# Patient Record
Sex: Female | Born: 1935 | Race: White | Hispanic: No | Marital: Married | Smoking: Never smoker
Health system: Southern US, Community
[De-identification: ages and names within clinical notes are randomized; demographics above are authoritative.]

## PROBLEM LIST (undated history)

## (undated) DIAGNOSIS — I509 Heart failure, unspecified: Secondary | ICD-10-CM

## (undated) DIAGNOSIS — I4891 Unspecified atrial fibrillation: Secondary | ICD-10-CM

## (undated) DIAGNOSIS — E039 Hypothyroidism, unspecified: Secondary | ICD-10-CM

## (undated) DIAGNOSIS — E876 Hypokalemia: Secondary | ICD-10-CM

## (undated) HISTORY — PX: OTHER SURGICAL HISTORY: SHX169

## (undated) HISTORY — PX: ABDOMINAL HYSTERECTOMY: SHX81

---

## 2006-06-07 ENCOUNTER — Ambulatory Visit: Payer: Self-pay | Admitting: Cardiology

## 2012-06-21 ENCOUNTER — Emergency Department (HOSPITAL_COMMUNITY): Payer: Medicare Other

## 2012-06-21 ENCOUNTER — Emergency Department (HOSPITAL_COMMUNITY)
Admission: EM | Admit: 2012-06-21 | Discharge: 2012-06-21 | Disposition: A | Discharge status: Home / Self Care | Payer: Medicare Other | Attending: Emergency Medicine | Admitting: Emergency Medicine

## 2012-06-21 ENCOUNTER — Encounter (HOSPITAL_COMMUNITY): Payer: Self-pay | Admitting: Emergency Medicine

## 2012-06-21 DIAGNOSIS — I4891 Unspecified atrial fibrillation: Secondary | ICD-10-CM | POA: Insufficient documentation

## 2012-06-21 DIAGNOSIS — I509 Heart failure, unspecified: Secondary | ICD-10-CM | POA: Insufficient documentation

## 2012-06-21 DIAGNOSIS — Z79899 Other long term (current) drug therapy: Secondary | ICD-10-CM | POA: Insufficient documentation

## 2012-06-21 DIAGNOSIS — Z7901 Long term (current) use of anticoagulants: Secondary | ICD-10-CM | POA: Insufficient documentation

## 2012-06-21 DIAGNOSIS — I959 Hypotension, unspecified: Secondary | ICD-10-CM | POA: Insufficient documentation

## 2012-06-21 DIAGNOSIS — N390 Urinary tract infection, site not specified: Secondary | ICD-10-CM | POA: Insufficient documentation

## 2012-06-21 DIAGNOSIS — E860 Dehydration: Secondary | ICD-10-CM | POA: Insufficient documentation

## 2012-06-21 DIAGNOSIS — E039 Hypothyroidism, unspecified: Secondary | ICD-10-CM | POA: Insufficient documentation

## 2012-06-21 DIAGNOSIS — D649 Anemia, unspecified: Secondary | ICD-10-CM | POA: Insufficient documentation

## 2012-06-21 DIAGNOSIS — N289 Disorder of kidney and ureter, unspecified: Secondary | ICD-10-CM | POA: Insufficient documentation

## 2012-06-21 HISTORY — DX: Hypokalemia: E87.6

## 2012-06-21 HISTORY — DX: Hypothyroidism, unspecified: E03.9

## 2012-06-21 HISTORY — DX: Heart failure, unspecified: I50.9

## 2012-06-21 HISTORY — DX: Unspecified atrial fibrillation: I48.91

## 2012-06-21 LAB — POCT I-STAT, CHEM 8
Calcium, Ion: 1.52 mmol/L — ABNORMAL HIGH (ref 1.13–1.30)
Chloride: 96 mEq/L (ref 96–112)
HCT: 24 % — ABNORMAL LOW (ref 36.0–46.0)
Potassium: 4.7 mEq/L (ref 3.5–5.1)

## 2012-06-21 LAB — URINE MICROSCOPIC-ADD ON

## 2012-06-21 LAB — URINALYSIS, ROUTINE W REFLEX MICROSCOPIC
Bilirubin Urine: NEGATIVE
Urobilinogen, UA: 0.2 mg/dL (ref 0.0–1.0)

## 2012-06-21 MED ORDER — CEPHALEXIN 500 MG PO CAPS: 500.0000 mg | ORAL_CAPSULE | Freq: Four times a day (QID) | ORAL | Status: DC

## 2012-06-21 NOTE — ED Notes (Signed)
POCT occult blood performed by Dr. Effie Shy, negative result.

## 2012-06-21 NOTE — ED Provider Notes (Signed)
Care assumed at the change of shift. Records from [facility] reviewed, for admission 04/20/2012. She had Hgb 8.2 on that admission as well, presumed to be GI bleeding losses, given transfusion, but family decided not to pursue further workup with GI on discharge. She is heme neg here. Hemodynamically stable. No episodes of hypotension here. Afrebrile, but has has UTI. Family is comfortable with her going home, Rx for Abx and close PCP follow up for recheck next week. Advised to return for any change of symptoms or for any other concerns.   Addie Alonge B. Bernette Mayers, MD 06/21/12 2311

## 2012-06-21 NOTE — ED Notes (Signed)
Per home health, patient started having some hypotension yesterday after already receiving Lotensin. Patient has not been given lotensin today, and home health care was told by MD to not give until 06/24/12.

## 2012-06-21 NOTE — ED Notes (Signed)
Patient from home, home health nurse called ems due to "blood pressure fluctuation". Home health nurse reported that patient had hypotension at home. Patient denies complaints at this time.

## 2012-06-21 NOTE — ED Provider Notes (Signed)
History     CSN: [id number]  Arrival date & time 06/21/12  2027   First MD Initiated Contact with Patient 06/21/12 2033      Chief Complaint  Patient presents with  . Hypotension    (Consider location/radiation/quality/duration/timing/severity/associated sxs/prior treatment) HPI Comments: Lynn Jordan is a 79 y.o. female who is here to be evaluated for "fluctuating blood pressure and ". The patient was well today other than a 1.0 blood pressure was low at 70/38. Another time. It was 85/40. It typically runs 100/70 the patient has been well recently and denies fever, chills, nausea, vomiting, weakness, or dizziness. She is recovering, at home, from a prolonged hospital stay, after a pneumonia. She is in home health care services. There are no other known modifying factors.  The history is provided by the patient and a relative (and her caregiver).    Past Medical History  Diagnosis Date  . Atrial fibrillation   . CHF (congestive heart failure)   . Hypokalemia   . Hypothyroid     Past Surgical History  Procedure Laterality Date  . Abdominal hysterectomy    . Bladder tack      History reviewed. No pertinent family history.  History  Substance Use Topics  . Smoking status: Not on file  . Smokeless tobacco: Not on file  . Alcohol Use: No    OB History   Grav Para Term Preterm Abortions TAB SAB Ect Mult Living                  Review of Systems  All other systems reviewed and are negative.    Allergies  Codeine; Morphine and related; and Amoxicillin  Home Medications   Current Outpatient Rx  Name  Route  Sig  Dispense  Refill  . acetaminophen (TYLENOL) 500 MG tablet   Oral   Take 500 mg by mouth every 6 (six) hours as needed for pain.         Marland Kitchen ALPRAZolam (XANAX) 0.25 MG tablet   Oral   Take 0.25 mg by mouth every 6 (six) hours as needed for sleep.         . benazepril (LOTENSIN) 10 MG tablet   Oral   Take 10 mg by mouth daily.         .  calcium-vitamin D (OSCAL WITH D) 500-200 MG-UNIT per tablet   Oral   Take 1 tablet by mouth 3 (three) times daily.         . furosemide (LASIX) 40 MG tablet   Oral   Take 40 mg by mouth 2 (two) times daily. *may take an additional one-half to one whole tablet as needed associated with 5 pounds of weight gain         . furosemide (LASIX) 40 MG tablet   Oral   Take 40 mg by mouth 2 (two) times daily.          Marland Kitchen levothyroxine (SYNTHROID, LEVOTHROID) 75 MCG tablet   Oral   Take 75 mcg by mouth daily before breakfast.         . potassium chloride SA (K-DUR,KLOR-CON) 20 MEQ tablet   Oral   Take 40 mEq by mouth 2 (two) times daily.         . pseudoephedrine-dextromethorphan-guaifenesin (TUSSIN CF) 30-10-100 MG/5ML solution   Oral   Take 10 mLs by mouth 4 (four) times daily as needed for cough.         . sodium chloride (OCEAN)  0.65 % nasal spray   Nasal   Place 2 sprays into the nose 2 (two) times daily.          Marland Kitchen warfarin (COUMADIN) 5 MG tablet   Oral   Take 5 mg by mouth every evening.            BP 118/47  Pulse 72  Temp(Src) 97.6 F (36.4 C) (Oral)  Resp 18  Ht 5\' 5"  (1.651 m)  Wt 130 lb (58.968 kg)  BMI 21.63 kg/m2  SpO2 96%  Physical Exam  Nursing note and vitals reviewed. Constitutional: She is oriented to person, place, and time. She appears well-developed.  Frail, elderly  HENT:  Head: Normocephalic and atraumatic.  Eyes: Conjunctivae and EOM are normal. Pupils are equal, round, and reactive to light.  Neck: Normal range of motion and phonation normal. Neck supple.  Cardiovascular: Normal rate, regular rhythm and intact distal pulses.   Pulmonary/Chest: Effort normal and breath sounds normal. She exhibits no tenderness.  Abdominal: Soft. Bowel sounds are normal. She exhibits no distension. There is no tenderness. There is no guarding.  Genitourinary:  Rectal exam: Normal Anus; no external hemorrhoids. Soft, brown stool in rectum. No palpable  mass. Exam was nontender. Fecal occult blood testing was negative.  Musculoskeletal: Normal range of motion. She exhibits edema (1+, bilateral).  Neurological: She is alert and oriented to person, place, and time. She has normal strength. She exhibits normal muscle tone.  Skin: Skin is warm and dry.  Psychiatric: She has a normal mood and affect. Her behavior is normal. Judgment and thought content normal.    ED Course  Procedures (including critical care time)   Reevaluation with update and discussion. After initial assessment and treatment, an updated evaluation reveals no further complaints. Her daughter recalls that her hemoglobin was 6 when she was hospitalized 3 months ago. There no comparison labs in the hospital computer system. Comparison labs ordered from [facility]. Akira Adelsberger L   Oral fluid trial    Labs Reviewed  URINALYSIS, ROUTINE W REFLEX MICROSCOPIC - Abnormal; Notable for the following:    APPearance HAZY (*)    Hgb urine dipstick SMALL (*)    Protein, ur TRACE (*)    Nitrite POSITIVE (*)    Leukocytes, UA LARGE (*)    All other components within normal limits  URINE MICROSCOPIC-ADD ON - Abnormal; Notable for the following:    Bacteria, UA MANY (*)    All other components within normal limits  POCT I-STAT, CHEM 8 - Abnormal; Notable for the following:    BUN 57 (*)    Creatinine, Ser 1.40 (*)    Glucose, Bld 120 (*)    Calcium, Ion 1.52 (*)    Hemoglobin 8.2 (*)    HCT 24.0 (*)    All other components within normal limits  URINE CULTURE   Dg Chest 2 View  06/21/2012   *RADIOLOGY REPORT*  Clinical Data: Hypotension  CHEST - 2 VIEW  Comparison: 04/21/2012  Findings: There is a marked kyphoscoliosis deformity involving the thoracic spine.  Mild cardiac enlargement noted.  No pleural effusion or edema.  No airspace consolidation.  Spondylosis is present within the thoracic spine.  IMPRESSION:  1.  No acute cardiopulmonary abnormalities.   Original Report  Authenticated By: Signa Kell, M.D.     1. Hypotensive episode   2. Dehydration   3. Renal insufficiency       MDM  Episode of hypotension, with normal blood pressure in  emergency department. 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 Hemoglobin is low, but apparently is improved from baseline. No apparent source of bleeding at the time of evaluation, in ED.     22:44-Care to oncoming provider team- to evaluate after comparison. Labs are obtained from [facility]. I believe that the patient could be discharged, , holding her Lasix for 2-3 days, pushing oral fluids, and following up with PCP in 4 or 5 days        Flint Melter, MD 06/21/12 2245

## 2012-06-24 LAB — URINE CULTURE

## 2012-06-24 LAB — OCCULT BLOOD, POC DEVICE: Fecal Occult Bld: NEGATIVE

## 2012-09-18 ENCOUNTER — Encounter (HOSPITAL_COMMUNITY): Payer: Self-pay

## 2012-09-18 ENCOUNTER — Emergency Department (HOSPITAL_COMMUNITY)
Admission: EM | Admit: 2012-09-18 | Discharge: 2012-09-18 | Disposition: A | Discharge status: Home / Self Care | Payer: Medicare Other | Attending: Emergency Medicine | Admitting: Emergency Medicine

## 2012-09-18 ENCOUNTER — Emergency Department (HOSPITAL_COMMUNITY): Payer: Medicare Other

## 2012-09-18 DIAGNOSIS — Z8639 Personal history of other endocrine, nutritional and metabolic disease: Secondary | ICD-10-CM | POA: Insufficient documentation

## 2012-09-18 DIAGNOSIS — R0682 Tachypnea, not elsewhere classified: Secondary | ICD-10-CM | POA: Insufficient documentation

## 2012-09-18 DIAGNOSIS — Y929 Unspecified place or not applicable: Secondary | ICD-10-CM | POA: Insufficient documentation

## 2012-09-18 DIAGNOSIS — S5011XA Contusion of right forearm, initial encounter: Secondary | ICD-10-CM

## 2012-09-18 DIAGNOSIS — Y939 Activity, unspecified: Secondary | ICD-10-CM | POA: Insufficient documentation

## 2012-09-18 DIAGNOSIS — I509 Heart failure, unspecified: Secondary | ICD-10-CM | POA: Insufficient documentation

## 2012-09-18 DIAGNOSIS — X58XXXA Exposure to other specified factors, initial encounter: Secondary | ICD-10-CM | POA: Insufficient documentation

## 2012-09-18 DIAGNOSIS — E039 Hypothyroidism, unspecified: Secondary | ICD-10-CM | POA: Insufficient documentation

## 2012-09-18 DIAGNOSIS — I4891 Unspecified atrial fibrillation: Secondary | ICD-10-CM | POA: Insufficient documentation

## 2012-09-18 DIAGNOSIS — I998 Other disorder of circulatory system: Secondary | ICD-10-CM | POA: Insufficient documentation

## 2012-09-18 DIAGNOSIS — Z862 Personal history of diseases of the blood and blood-forming organs and certain disorders involving the immune mechanism: Secondary | ICD-10-CM | POA: Insufficient documentation

## 2012-09-18 DIAGNOSIS — Z79899 Other long term (current) drug therapy: Secondary | ICD-10-CM | POA: Insufficient documentation

## 2012-09-18 DIAGNOSIS — S5010XA Contusion of unspecified forearm, initial encounter: Secondary | ICD-10-CM | POA: Insufficient documentation

## 2012-09-18 NOTE — ED Notes (Signed)
Pt's family and caregiver noted a large bruise to pt's right forearm today. Unsure of cause. Pt denies any known injury.

## 2012-09-18 NOTE — ED Notes (Signed)
Pt reports noticed tenderness to r forearm yesterday and today area raised and bruised.  Denies any injury.

## 2012-09-18 NOTE — ED Provider Notes (Signed)
CSN: [id number]     Arrival date & time 09/18/12  1357 History     First MD Initiated Contact with Patient 09/18/12 1413     Chief Complaint  Patient presents with  . bruise r forearm    (Consider location/radiation/quality/duration/timing/severity/associated sxs/prior Treatment) Patient is a 79 y.o. female presenting with arm injury. The history is provided by the patient and a relative. No language interpreter was used.  Arm Injury Location:  Elbow Time since incident:  1 day Upper extremity injury: unsure.   Elbow location:  R elbow Pain details:    Quality:  Aching (aching yesterday, feels "ok" today per pt.)   Radiates to:  Does not radiate   Severity:  No pain   Onset quality:  Sudden   Duration:  1 day   Timing:  Constant Chronicity:  New Dislocation: no   Foreign body present:  No foreign bodies Tetanus status:  Unknown Prior injury to area: hx of dec strength in arm due to polio in 20's. Relieved by:  Nothing Worsened by:  Nothing tried Ineffective treatments:  None tried Associated symptoms: decreased range of motion (at baseline)   Associated symptoms: no back pain, no fatigue, no fever, no neck pain, no numbness and no stiffness   Associated symptoms comment:  Raised ecchymosis over prox ulnar which has spread since yesterday per family Risk factors: no concern for non-accidental trauma, no frequent fractures and no recent illness     Past Medical History  Diagnosis Date  . Atrial fibrillation   . CHF (congestive heart failure)   . Hypokalemia   . Hypothyroid    Past Surgical History  Procedure Laterality Date  . Abdominal hysterectomy    . Bladder tack     No family history on file. History  Substance Use Topics  . Smoking status: Not on file  . Smokeless tobacco: Not on file  . Alcohol Use: No   OB History   Grav Para Term Preterm Abortions TAB SAB Ect Mult Living                 Review of Systems  Constitutional: Negative for fever,  chills, diaphoresis, activity change, appetite change and fatigue.  HENT: Negative for congestion, sore throat, facial swelling, rhinorrhea, neck pain and neck stiffness.   Eyes: Negative for photophobia and discharge.  Respiratory: Negative for cough, chest tightness and shortness of breath.   Cardiovascular: Negative for chest pain, palpitations and leg swelling.  Gastrointestinal: Negative for nausea, vomiting, abdominal pain and diarrhea.  Endocrine: Negative for polydipsia and polyuria.  Genitourinary: Negative for dysuria, frequency, difficulty urinating and pelvic pain.  Musculoskeletal: Negative for back pain, arthralgias and stiffness.  Skin: Negative for color change and wound.  Allergic/Immunologic: Negative for immunocompromised state.  Neurological: Negative for facial asymmetry, weakness, numbness and headaches.  Hematological: Bruises/bleeds easily.  Psychiatric/Behavioral: Negative for confusion and agitation.    Allergies  Codeine; Morphine and related; and Amoxicillin  Home Medications   Current Outpatient Rx  Name  Route  Sig  Dispense  Refill  . acetaminophen (TYLENOL) 500 MG tablet   Oral   Take 500 mg by mouth every 6 (six) hours as needed for pain.         Marland Kitchen ALPRAZolam (XANAX) 0.25 MG tablet   Oral   Take 0.25 mg by mouth every 6 (six) hours as needed for sleep.         . benazepril (LOTENSIN) 10 MG tablet   Oral  Take 10 mg by mouth daily.         . calcium-vitamin D (OSCAL WITH D) 500-200 MG-UNIT per tablet   Oral   Take 1 tablet by mouth 3 (three) times daily.         . Ferrous Sulfate Dried (SLOW RELEASE IRON) 45 MG TBCR   Oral   Take 2 tablets by mouth daily.         . furosemide (LASIX) 40 MG tablet   Oral   Take 20 mg by mouth 2 (two) times daily. *may take an additional one-half to one whole tablet as needed associated with 5 pounds of weight gain         . levothyroxine (SYNTHROID, LEVOTHROID) 125 MCG tablet   Oral   Take  125 mcg by mouth daily before breakfast.         . OVER THE COUNTER MEDICATION   Both Eyes   Place 1 drop into both eyes 2 (two) times daily. OTC eye drops for dry eyes         . potassium chloride (K-DUR,KLOR-CON) 10 MEQ tablet   Oral   Take 20 mEq by mouth 2 (two) times daily.         . sodium chloride (OCEAN) 0.65 % nasal spray   Nasal   Place 2 sprays into the nose 2 (two) times daily.          Marland Kitchen warfarin (COUMADIN) 5 MG tablet   Oral   Take 7.5 mg by mouth every evening.           BP 154/53  Pulse 88  Temp(Src) 97.7 F (36.5 C) (Oral)  Resp 40  Wt 135 lb (61.236 kg)  BMI 22.47 kg/m2  SpO2 95% Physical Exam  Constitutional: She is oriented to person, place, and time. She appears well-developed and well-nourished. No distress.  HENT:  Head: Normocephalic and atraumatic.  Mouth/Throat: No oropharyngeal exudate.  Eyes: Pupils are equal, round, and reactive to light.  Neck: Normal range of motion. Neck supple.  Cardiovascular: Normal rate, regular rhythm and normal heart sounds.  Exam reveals no gallop and no friction rub.   No murmur heard. Pulmonary/Chest: Breath sounds normal. Tachypnea (but was off home 2.5L by [place] during exam. ) noted. No respiratory distress. She has no wheezes. She has no rales.  Abdominal: Soft. Bowel sounds are normal. She exhibits no distension and no mass. There is no tenderness. There is no rebound and no guarding.  Musculoskeletal: Normal range of motion. She exhibits no edema and no tenderness.       Arms: Neurological: She is alert and oriented to person, place, and time.  Skin: Skin is warm and dry.  Psychiatric: She has a normal mood and affect.    ED Course   Procedures (including critical care time)  Labs Reviewed  PROTIME-INR - Abnormal; Notable for the following:    Prothrombin Time 27.5 (*)    INR 2.67 (*)    All other components within normal limits   Dg Elbow 2 Views Right  09/18/2012   *RADIOLOGY REPORT*   Clinical Data: Tenderness and bruising  RIGHT ELBOW - 2 VIEW  Comparison: None.  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  There may be a soft tissue injury posteriorly in the olecranon region.  I do not see an acute fracture.  IMPRESSION: Advanced chronic arthropathy with multiple intra-articular loose bodies.   Original Report Authenticated  By: Paulina Fusi, M.D.   Dg Forearm Right  09/18/2012   *RADIOLOGY REPORT*  Clinical Data: Bruising and tenderness of the proximal forearm.  RIGHT FOREARM - 2 VIEW  Comparison: Same day  Findings: No evidence of ulnar or radial fracture.  There is advanced chronic arthropathy of the elbow joint with multiple intra- articular loose bodies.  IMPRESSION: No acute fracture of the radius or ulna.  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   Original Report Authenticated By: Paulina Fusi, M.D.   1. Traumatic hematoma of forearm, right, initial encounter     MDM  Pt is a 79 y.o. female with Pmhx as above who presents with concern for worsening ecchymosis over proximal R ulnar.  Pt reports it was sore yesterday, but feels fine today.  Pt tachypneic upon arrival, but was off home O2, denies CP, SOB, ab pain, n/v, d/a, fever.  Pt on coumadin for afib, INR 2.67.  XR ordered and was negative for acute bony injury.  Will d/c home with instructions for elevation, observation of area.  Family warned that bruise may be wider tomorrow as blood settles.  Return precautions given for new or worsening symptoms including chest pain, trouble breathing, swelling, blood in gums, stool, urine.  .  1. Traumatic hematoma of forearm, right, initial encounter       Shanna Cisco, MD 09/18/12 1540

## 2014-09-06 ENCOUNTER — Encounter (HOSPITAL_COMMUNITY): Payer: Self-pay

## 2014-09-06 ENCOUNTER — Emergency Department (HOSPITAL_COMMUNITY): Payer: Medicare Other

## 2014-09-06 ENCOUNTER — Inpatient Hospital Stay (HOSPITAL_COMMUNITY)
Admission: EM | Admit: 2014-09-06 | Discharge: 2014-09-12 | DRG: 291 | Disposition: A | Discharge status: Hospice | Payer: Medicare Other | Attending: Internal Medicine | Admitting: Internal Medicine

## 2014-09-06 DIAGNOSIS — Z7189 Other specified counseling: Secondary | ICD-10-CM | POA: Insufficient documentation

## 2014-09-06 DIAGNOSIS — Z885 Allergy status to narcotic agent status: Secondary | ICD-10-CM

## 2014-09-06 DIAGNOSIS — J9601 Acute respiratory failure with hypoxia: Secondary | ICD-10-CM | POA: Diagnosis present

## 2014-09-06 DIAGNOSIS — I083 Combined rheumatic disorders of mitral, aortic and tricuspid valves: Secondary | ICD-10-CM | POA: Diagnosis present

## 2014-09-06 DIAGNOSIS — F419 Anxiety disorder, unspecified: Secondary | ICD-10-CM | POA: Diagnosis not present

## 2014-09-06 DIAGNOSIS — Z8249 Family history of ischemic heart disease and other diseases of the circulatory system: Secondary | ICD-10-CM | POA: Diagnosis not present

## 2014-09-06 DIAGNOSIS — E038 Other specified hypothyroidism: Secondary | ICD-10-CM

## 2014-09-06 DIAGNOSIS — I482 Chronic atrial fibrillation: Secondary | ICD-10-CM | POA: Diagnosis present

## 2014-09-06 DIAGNOSIS — I5031 Acute diastolic (congestive) heart failure: Secondary | ICD-10-CM | POA: Diagnosis not present

## 2014-09-06 DIAGNOSIS — D509 Iron deficiency anemia, unspecified: Secondary | ICD-10-CM | POA: Diagnosis not present

## 2014-09-06 DIAGNOSIS — R001 Bradycardia, unspecified: Secondary | ICD-10-CM | POA: Diagnosis not present

## 2014-09-06 DIAGNOSIS — Z7901 Long term (current) use of anticoagulants: Secondary | ICD-10-CM | POA: Diagnosis not present

## 2014-09-06 DIAGNOSIS — I959 Hypotension, unspecified: Secondary | ICD-10-CM | POA: Diagnosis not present

## 2014-09-06 DIAGNOSIS — J9600 Acute respiratory failure, unspecified whether with hypoxia or hypercapnia: Secondary | ICD-10-CM | POA: Diagnosis present

## 2014-09-06 DIAGNOSIS — I350 Nonrheumatic aortic (valve) stenosis: Secondary | ICD-10-CM

## 2014-09-06 DIAGNOSIS — I4891 Unspecified atrial fibrillation: Secondary | ICD-10-CM | POA: Diagnosis present

## 2014-09-06 DIAGNOSIS — Z79899 Other long term (current) drug therapy: Secondary | ICD-10-CM

## 2014-09-06 DIAGNOSIS — E039 Hypothyroidism, unspecified: Secondary | ICD-10-CM | POA: Diagnosis present

## 2014-09-06 DIAGNOSIS — D649 Anemia, unspecified: Secondary | ICD-10-CM | POA: Diagnosis not present

## 2014-09-06 DIAGNOSIS — I272 Other secondary pulmonary hypertension: Secondary | ICD-10-CM | POA: Diagnosis present

## 2014-09-06 DIAGNOSIS — D696 Thrombocytopenia, unspecified: Secondary | ICD-10-CM | POA: Diagnosis not present

## 2014-09-06 DIAGNOSIS — Z515 Encounter for palliative care: Secondary | ICD-10-CM | POA: Insufficient documentation

## 2014-09-06 DIAGNOSIS — Z66 Do not resuscitate: Secondary | ICD-10-CM | POA: Diagnosis present

## 2014-09-06 DIAGNOSIS — Z90710 Acquired absence of both cervix and uterus: Secondary | ICD-10-CM

## 2014-09-06 DIAGNOSIS — I10 Essential (primary) hypertension: Secondary | ICD-10-CM | POA: Diagnosis present

## 2014-09-06 DIAGNOSIS — J810 Acute pulmonary edema: Secondary | ICD-10-CM

## 2014-09-06 DIAGNOSIS — D539 Nutritional anemia, unspecified: Secondary | ICD-10-CM | POA: Diagnosis present

## 2014-09-06 DIAGNOSIS — I5033 Acute on chronic diastolic (congestive) heart failure: Secondary | ICD-10-CM | POA: Diagnosis present

## 2014-09-06 DIAGNOSIS — Z880 Allergy status to penicillin: Secondary | ICD-10-CM | POA: Diagnosis not present

## 2014-09-06 DIAGNOSIS — J9621 Acute and chronic respiratory failure with hypoxia: Secondary | ICD-10-CM | POA: Diagnosis present

## 2014-09-06 DIAGNOSIS — I509 Heart failure, unspecified: Secondary | ICD-10-CM | POA: Diagnosis not present

## 2014-09-06 DIAGNOSIS — R0602 Shortness of breath: Secondary | ICD-10-CM | POA: Diagnosis not present

## 2014-09-06 LAB — IRON AND TIBC
Iron: 43 ug/dL (ref 28–170)
Saturation Ratios: 17 % (ref 10.4–31.8)
TIBC: 256 ug/dL (ref 250–450)
UIBC: 213 ug/dL

## 2014-09-06 LAB — URINALYSIS, ROUTINE W REFLEX MICROSCOPIC
Bilirubin Urine: NEGATIVE
Glucose, UA: NEGATIVE mg/dL
Ketones, ur: NEGATIVE mg/dL
LEUKOCYTES UA: NEGATIVE
NITRITE: NEGATIVE
PH: 6 (ref 5.0–8.0)
Protein, ur: NEGATIVE mg/dL
SPECIFIC GRAVITY, URINE: 1.01 (ref 1.005–1.030)
Urobilinogen, UA: 0.2 mg/dL (ref 0.0–1.0)

## 2014-09-06 LAB — COMPREHENSIVE METABOLIC PANEL
ALK PHOS: 77 U/L (ref 38–126)
ALT: 9 U/L — AB (ref 14–54)
AST: 19 U/L (ref 15–41)
Albumin: 2.8 g/dL — ABNORMAL LOW (ref 3.5–5.0)
Anion gap: 10 (ref 5–15)
BILIRUBIN TOTAL: 1.2 mg/dL (ref 0.3–1.2)
BUN: 27 mg/dL — AB (ref 6–20)
CALCIUM: 9.8 mg/dL (ref 8.9–10.3)
CHLORIDE: 91 mmol/L — AB (ref 101–111)
CO2: 40 mmol/L — ABNORMAL HIGH (ref 22–32)
CREATININE: 0.78 mg/dL (ref 0.44–1.00)
GFR calc Af Amer: >60 mL/min (ref >60)
GFR calc non Af Amer: >60 mL/min (ref >60)
Glucose, Bld: 108 mg/dL — ABNORMAL HIGH (ref 65–99)
Potassium: 3.7 mmol/L (ref 3.5–5.1)
Sodium: 141 mmol/L (ref 135–145)
Total Protein: 6.5 g/dL (ref 6.5–8.1)

## 2014-09-06 LAB — CBC WITH DIFFERENTIAL/PLATELET
Basophils Absolute: 0 10*3/uL (ref 0.0–0.1)
Basophils Relative: 0 % (ref 0–1)
EOS PCT: 2 % (ref 0–5)
Eosinophils Absolute: 0.1 10*3/uL (ref 0.0–0.7)
HCT: 21.7 % — ABNORMAL LOW (ref 36.0–46.0)
Hemoglobin: 6.1 g/dL — CL (ref 12.0–15.0)
Lymphocytes Relative: 7 % — ABNORMAL LOW (ref 12–46)
Lymphs Abs: 0.4 10*3/uL — ABNORMAL LOW (ref 0.7–4.0)
MCH: 28.8 pg (ref 26.0–34.0)
MCHC: 28.1 g/dL — ABNORMAL LOW (ref 30.0–36.0)
MCV: 102.4 fL — AB (ref 78.0–100.0)
Monocytes Absolute: 0.6 10*3/uL (ref 0.1–1.0)
Monocytes Relative: 10 % (ref 3–12)
Neutro Abs: 5.3 10*3/uL (ref 1.7–7.7)
Neutrophils Relative %: 81 % — ABNORMAL HIGH (ref 43–77)
Platelets: 104 10*3/uL — ABNORMAL LOW (ref 150–400)
RBC: 2.12 MIL/uL — AB (ref 3.87–5.11)
RDW: 18.1 % — ABNORMAL HIGH (ref 11.5–15.5)
WBC: 6.4 10*3/uL (ref 4.0–10.5)

## 2014-09-06 LAB — VITAMIN B12: VITAMIN B 12: 498 pg/mL (ref 180–914)

## 2014-09-06 LAB — URINE MICROSCOPIC-ADD ON

## 2014-09-06 LAB — CBC
HCT: 28.2 % — ABNORMAL LOW (ref 36.0–46.0)
Hemoglobin: 8.5 g/dL — ABNORMAL LOW (ref 12.0–15.0)
MCH: 29.7 pg (ref 26.0–34.0)
MCHC: 30.1 g/dL (ref 30.0–36.0)
MCV: 98.6 fL (ref 78.0–100.0)
Platelets: 86 10*3/uL — ABNORMAL LOW (ref 150–400)
RBC: 2.86 MIL/uL — AB (ref 3.87–5.11)
RDW: 17.5 % — ABNORMAL HIGH (ref 11.5–15.5)
WBC: 5.7 10*3/uL (ref 4.0–10.5)

## 2014-09-06 LAB — RETICULOCYTES
RBC.: 2.13 MIL/uL — ABNORMAL LOW (ref 3.87–5.11)
RETIC COUNT ABSOLUTE: 85.2 10*3/uL (ref 19.0–186.0)
Retic Ct Pct: 4 % — ABNORMAL HIGH (ref 0.4–3.1)

## 2014-09-06 LAB — TROPONIN I
TROPONIN I: 0.03 ng/mL (ref <0.031)
Troponin I: 0.03 ng/mL (ref <0.031)
Troponin I: 0.03 ng/mL (ref <0.031)

## 2014-09-06 LAB — PREPARE RBC (CROSSMATCH)

## 2014-09-06 LAB — FOLATE: Folate: 10.3 ng/mL (ref >5.9)

## 2014-09-06 LAB — ABO/RH: ABO/RH(D): A NEG

## 2014-09-06 LAB — MRSA PCR SCREENING: MRSA by PCR: NEGATIVE

## 2014-09-06 LAB — FERRITIN: FERRITIN: 114 ng/mL (ref 11–307)

## 2014-09-06 LAB — OCCULT BLOOD, POC DEVICE: Fecal Occult Bld: NEGATIVE

## 2014-09-06 LAB — TSH: TSH: 0.127 u[IU]/mL — ABNORMAL LOW (ref 0.350–4.500)

## 2014-09-06 LAB — PROTIME-INR
INR: 2.27 — AB (ref 0.00–1.49)
Prothrombin Time: 24.8 seconds — ABNORMAL HIGH (ref 11.6–15.2)

## 2014-09-06 LAB — BRAIN NATRIURETIC PEPTIDE: B NATRIURETIC PEPTIDE 5: 403 pg/mL — AB (ref 0.0–100.0)

## 2014-09-06 MED ORDER — SODIUM CHLORIDE 0.9 % IJ SOLN
3.0000 mL | Freq: Two times a day (BID) | INTRAMUSCULAR | Status: DC
  Administered 2014-09-06 – 2014-09-12 (×13): 3 mL via INTRAVENOUS

## 2014-09-06 MED ORDER — POTASSIUM CHLORIDE CRYS ER 20 MEQ PO TBCR
20.0000 meq | EXTENDED_RELEASE_TABLET | Freq: Two times a day (BID) | ORAL | Status: DC
  Administered 2014-09-06 – 2014-09-07 (×3): 20 meq via ORAL
  Filled 2014-09-06 (×3): qty 1

## 2014-09-06 MED ORDER — CALCIUM CARBONATE-VITAMIN D 500-200 MG-UNIT PO TABS
1.0000 | ORAL_TABLET | Freq: Three times a day (TID) | ORAL | Status: DC
  Administered 2014-09-06 – 2014-09-12 (×18): 1 via ORAL
  Filled 2014-09-06 (×26): qty 1

## 2014-09-06 MED ORDER — SODIUM CHLORIDE 0.9 % IV SOLN
Freq: Once | INTRAVENOUS | Status: AC
  Administered 2014-09-06: 16:00:00 via INTRAVENOUS

## 2014-09-06 MED ORDER — DEXTROSE 5 % IV SOLN
5.0000 mg/h | INTRAVENOUS | Status: DC
  Administered 2014-09-06: 5 mg/h via INTRAVENOUS
  Filled 2014-09-06: qty 100

## 2014-09-06 MED ORDER — DILTIAZEM LOAD VIA INFUSION
10.0000 mg | Freq: Once | INTRAVENOUS | Status: AC
  Administered 2014-09-06: 10 mg via INTRAVENOUS
  Filled 2014-09-06: qty 10

## 2014-09-06 MED ORDER — WARFARIN SODIUM 7.5 MG PO TABS
7.5000 mg | ORAL_TABLET | Freq: Once | ORAL | Status: AC
  Administered 2014-09-06: 7.5 mg via ORAL
  Filled 2014-09-06: qty 1

## 2014-09-06 MED ORDER — ALPRAZOLAM 0.25 MG PO TABS
0.2500 mg | ORAL_TABLET | Freq: Four times a day (QID) | ORAL | Status: DC | PRN
  Administered 2014-09-07 – 2014-09-08 (×3): 0.25 mg via ORAL
  Filled 2014-09-06 (×3): qty 1

## 2014-09-06 MED ORDER — FUROSEMIDE 10 MG/ML IJ SOLN
40.0000 mg | Freq: Two times a day (BID) | INTRAMUSCULAR | Status: DC
  Administered 2014-09-06 – 2014-09-08 (×4): 40 mg via INTRAVENOUS
  Filled 2014-09-06 (×4): qty 4

## 2014-09-06 MED ORDER — LEVOTHYROXINE SODIUM 25 MCG PO TABS
125.0000 ug | ORAL_TABLET | Freq: Every day | ORAL | Status: DC
  Administered 2014-09-07 – 2014-09-12 (×6): 125 ug via ORAL
  Filled 2014-09-06 (×12): qty 1

## 2014-09-06 MED ORDER — ENSURE ENLIVE PO LIQD
237.0000 mL | Freq: Two times a day (BID) | ORAL | Status: DC
  Administered 2014-09-07 – 2014-09-12 (×9): 237 mL via ORAL

## 2014-09-06 MED ORDER — ONDANSETRON HCL 4 MG/2ML IJ SOLN: 4.0000 mg | Freq: Four times a day (QID) | INTRAMUSCULAR | Status: DC | PRN

## 2014-09-06 MED ORDER — SODIUM CHLORIDE 0.9 % IV SOLN
250.0000 mL | INTRAVENOUS | Status: DC | PRN
  Administered 2014-09-06: 250 mL via INTRAVENOUS

## 2014-09-06 MED ORDER — FUROSEMIDE 10 MG/ML IJ SOLN
40.0000 mg | Freq: Once | INTRAMUSCULAR | Status: AC
  Administered 2014-09-06: 40 mg via INTRAVENOUS
  Filled 2014-09-06: qty 4

## 2014-09-06 MED ORDER — SODIUM CHLORIDE 0.9 % IJ SOLN
3.0000 mL | INTRAMUSCULAR | Status: DC | PRN
  Administered 2014-09-09 – 2014-09-10 (×3): 3 mL via INTRAVENOUS
  Filled 2014-09-06 (×3): qty 3

## 2014-09-06 MED ORDER — ASPIRIN EC 81 MG PO TBEC
81.0000 mg | DELAYED_RELEASE_TABLET | Freq: Every day | ORAL | Status: DC
  Administered 2014-09-06 – 2014-09-08 (×3): 81 mg via ORAL
  Filled 2014-09-06 (×3): qty 1

## 2014-09-06 MED ORDER — CETYLPYRIDINIUM CHLORIDE 0.05 % MT LIQD
7.0000 mL | Freq: Two times a day (BID) | OROMUCOSAL | Status: DC
  Administered 2014-09-06 – 2014-09-12 (×11): 7 mL via OROMUCOSAL

## 2014-09-06 MED ORDER — WARFARIN - PHARMACIST DOSING INPATIENT
Freq: Every day | Status: DC
Start: 2014-09-06 — End: 2014-09-07

## 2014-09-06 MED ORDER — SODIUM CHLORIDE 0.9 % IV SOLN: 10.0000 mL/h | Freq: Once | INTRAVENOUS | Status: DC

## 2014-09-06 MED ORDER — BENAZEPRIL HCL 10 MG PO TABS
10.0000 mg | ORAL_TABLET | Freq: Every day | ORAL | Status: DC
Start: 2014-09-07 — End: 2014-09-08
  Administered 2014-09-07 – 2014-09-08 (×2): 10 mg via ORAL
  Filled 2014-09-06 (×3): qty 1

## 2014-09-06 MED ORDER — CARVEDILOL 3.125 MG PO TABS
3.1250 mg | ORAL_TABLET | Freq: Two times a day (BID) | ORAL | Status: DC
Start: 2014-09-06 — End: 2014-09-08
  Administered 2014-09-06 – 2014-09-08 (×3): 3.125 mg via ORAL
  Filled 2014-09-06 (×6): qty 1

## 2014-09-06 MED ORDER — ACETAMINOPHEN 325 MG PO TABS
650.0000 mg | ORAL_TABLET | ORAL | Status: DC | PRN
  Administered 2014-09-09 – 2014-09-12 (×5): 650 mg via ORAL
  Filled 2014-09-06 (×5): qty 2

## 2014-09-06 NOTE — H&P (Addendum)
[facility]          History and Physical    PCP:   Terald Sleeper, PA-C   EDP: Leo Grosser, MD  Chief Complaint:  SOB, LE swelling  HPI: 81 y/o woman with history significant for CHF type unknown, atrial fibrillation and hypothyroidism who presents to the hospital today with the above complaints. Wife at bedside provides most of the history and states that for the past 6 days she has had increasing lower extremity swelling. Today her caregiver became concerned because in the days that she had not seen the patient the swelling was a lot worse as well as increased shortness of breath to the point where they had to increase her oxygen from her typical 20 half to 3-1/2. Upon arrival of EMS she was noted to be in the 70s on 3.5 L. She was also noted to be in atrial fibrillation with rapid ventricular response. On arrival to the ED, she was started on a Cardizem drip, she was noted to have a hemoglobin of 6.1, chest x-ray showed pulmonary edema, we're asked to admit her for further evaluation and management.  Allergies:   Allergies  Allergen Reactions  . Codeine Nausea And Vomiting  . Morphine And Related Nausea And Vomiting  . Amoxicillin Nausea And Vomiting and Rash      Past Medical History  Diagnosis Date  . Atrial fibrillation   . CHF (congestive heart failure)   . Hypokalemia   . Hypothyroid     Past Surgical History  Procedure Laterality Date  . Abdominal hysterectomy    . Bladder tack      Prior to Admission medications   Medication Sig Start Date End Date Taking? Authorizing Provider  ALPRAZolam (XANAX) 0.25 MG tablet Take 0.25 mg by mouth every 6 (six) hours as needed for sleep.   Yes Historical Provider, MD  benazepril (LOTENSIN) 10 MG tablet Take 10 mg by mouth daily.   Yes Historical Provider, MD  calcium-vitamin D (OSCAL WITH D) 500-200 MG-UNIT per tablet Take 1 tablet by mouth 3 (three) times daily.   Yes Historical Provider, MD    Ferrous Sulfate Dried (SLOW RELEASE IRON) 45 MG TBCR Take 2 tablets by mouth daily.   Yes Historical Provider, MD  furosemide (LASIX) 40 MG tablet Take 20 mg by mouth 2 (two) times daily. *may take an additional one-half to one whole tablet as needed associated with 5 pounds of weight gain   Yes Historical Provider, MD  levothyroxine (SYNTHROID, LEVOTHROID) 125 MCG tablet Take 125 mcg by mouth daily before breakfast.   Yes Historical Provider, MD  OVER THE COUNTER MEDICATION Place 1 drop into both eyes 2 (two) times daily. OTC eye drops for dry eyes   Yes Historical Provider, MD  potassium chloride (K-DUR,KLOR-CON) 10 MEQ tablet Take 20 mEq by mouth 2 (two) times daily.   Yes Historical Provider, MD  sodium chloride (OCEAN) 0.65 % nasal spray Place 2 sprays into the nose 2 (two) times daily.    Yes Historical Provider, MD  warfarin (COUMADIN) 5 MG tablet Take 7.5 mg by mouth every evening.    Yes Historical Provider, MD    Social History:  reports that she has never smoked. She does not have any smokeless tobacco history on file. She reports that she does not drink alcohol or use illicit drugs.  FAmily HIstory CHF in both parents, HTn in both  parents.  Review of Systems:  Constitutional: Denies fever, chills, diaphoresis, appetite change and fatigue.  HEENT: Denies photophobia, eye pain, redness, hearing loss, ear pain, congestion, sore throat, rhinorrhea, sneezing, mouth sores, trouble swallowing, neck pain, neck stiffness and tinnitus.   Respiratory: Denies chest tightness,  and wheezing.   Cardiovascular: Denies chest pain, palpitations  Gastrointestinal: Denies nausea, vomiting, abdominal pain, diarrhea, constipation, blood in stool and abdominal distention.  Genitourinary: Denies dysuria, urgency, frequency, hematuria, flank pain and difficulty urinating.  Endocrine: Denies: hot or cold intolerance, sweats, changes in hair or nails, polyuria, polydipsia. Musculoskeletal: Denies  myalgias, back pain, joint swelling, arthralgias and gait problem.  Skin: Denies pallor, rash and wound.  Neurological: Denies dizziness, seizures, syncope, weakness, light-headedness, numbness and headaches.  Hematological: Denies adenopathy. Easy bruising, personal or family bleeding history  Psychiatric/Behavioral: Denies suicidal ideation, mood changes, confusion, nervousness, sleep disturbance and agitation   Physical Exam: Blood pressure 125/51, pulse 84, temperature 98.3 F (36.8 C), temperature source Oral, resp. rate 22, SpO2 100 %. General: Alert, awake, oriented 3. HEENT: Normocephalic, atraumatic, pupils equal and reactive to light, extraocular movements intact. Neck: Supple, no lymphadenopathy, no bruits, no goiter. Cardiovascular: Regular rate and rhythm, no murmurs, rubs or gallops. Lungs: Bibasilar crackles, no wheezes or rhonchi. Abdomen: Soft, nontender, nondistended, positive bowel sounds, no masses or organomegaly noted. Extremities: 4++ pitting edema bilaterally all the way up to her waist Neurologic: Grossly intact, moves all 4 spontaneously  Labs on Admission:  Results for orders placed or performed during the hospital encounter of 09/06/14 (from the past 48 hour(s))  Urinalysis, Routine w reflex microscopic (not at [facility])     Status: Abnormal   Collection Time: 09/06/14 12:51 PM  Result Value Ref Range   Color, Urine YELLOW YELLOW   APPearance CLEAR CLEAR   Specific Gravity, Urine 1.010 1.005 - 1.030   pH 6.0 5.0 - 8.0   Glucose, UA NEGATIVE NEGATIVE mg/dL   Hgb urine dipstick MODERATE (A) NEGATIVE   Bilirubin Urine NEGATIVE NEGATIVE   Ketones, ur NEGATIVE NEGATIVE mg/dL   Protein, ur NEGATIVE NEGATIVE mg/dL   Urobilinogen, UA 0.2 0.0 - 1.0 mg/dL   Nitrite NEGATIVE NEGATIVE   Leukocytes, UA NEGATIVE NEGATIVE  Urine microscopic-add on     Status: Abnormal   Collection Time: 09/06/14 12:51 PM  Result Value Ref Range   Squamous Epithelial / LPF MANY (A)  RARE   RBC / HPF 7-10 <3 RBC/hpf  Comprehensive metabolic panel     Status: Abnormal   Collection Time: 09/06/14  1:02 PM  Result Value Ref Range   Sodium 141 135 - 145 mmol/L   Potassium 3.7 3.5 - 5.1 mmol/L   Chloride 91 (L) 101 - 111 mmol/L   CO2 40 (H) 22 - 32 mmol/L   Glucose, Bld 108 (H) 65 - 99 mg/dL   BUN 27 (H) 6 - 20 mg/dL   Creatinine, Ser 0.78 0.44 - 1.00 mg/dL   Calcium 9.8 8.9 - 10.3 mg/dL   Total Protein 6.5 6.5 - 8.1 g/dL   Albumin 2.8 (L) 3.5 - 5.0 g/dL   AST 19 15 - 41 U/L   ALT 9 (L) 14 - 54 U/L   Alkaline Phosphatase 77 38 - 126 U/L   Total Bilirubin 1.2 0.3 - 1.2 mg/dL   GFR calc non Af Amer >60 >60 mL/min   GFR calc Af Amer >60 >60 mL/min    Comment: (NOTE) The eGFR has been calculated using the CKD EPI equation.  This calculation has not been validated in all clinical situations. eGFR's persistently <60 mL/min signify possible Chronic Kidney Disease.    Anion gap 10 5 - 15  CBC with Differential/Platelet     Status: Abnormal   Collection Time: 09/06/14  1:02 PM  Result Value Ref Range   WBC 6.4 4.0 - 10.5 K/uL   RBC 2.12 (L) 3.87 - 5.11 MIL/uL   Hemoglobin 6.1 (LL) 12.0 - 15.0 g/dL    Comment: CRITICAL RESULT CALLED TO, READ BACK BY AND VERIFIED WITH: CREWS M. AT 1326 ON [id number] BY THOMPSON S.    HCT 21.7 (L) 36.0 - 46.0 %   MCV 102.4 (H) 78.0 - 100.0 fL   MCH 28.8 26.0 - 34.0 pg   MCHC 28.1 (L) 30.0 - 36.0 g/dL   RDW 18.1 (H) 11.5 - 15.5 %   Platelets 104 (L) 150 - 400 K/uL   Neutrophils Relative % 81 (H) 43 - 77 %   Lymphocytes Relative 7 (L) 12 - 46 %   Monocytes Relative 10 3 - 12 %   Eosinophils Relative 2 0 - 5 %   Basophils Relative 0 0 - 1 %   Neutro Abs 5.3 1.7 - 7.7 K/uL   Lymphs Abs 0.4 (L) 0.7 - 4.0 K/uL   Monocytes Absolute 0.6 0.1 - 1.0 K/uL   Eosinophils Absolute 0.1 0.0 - 0.7 K/uL   Basophils Absolute 0.0 0.0 - 0.1 K/uL   RBC Morphology POLYCHROMASIA PRESENT     Comment: BASOPHILIC STIPPLING   WBC Morphology SPECIMEN CHECKED  FOR CLOTS    Smear Review PLATELET COUNT CONFIRMED BY SMEAR   Troponin I     Status: None   Collection Time: 09/06/14  1:02 PM  Result Value Ref Range   Troponin I 0.03 <0.031 ng/mL    Comment:        NO INDICATION OF MYOCARDIAL INJURY.   Brain natriuretic peptide     Status: Abnormal   Collection Time: 09/06/14  1:02 PM  Result Value Ref Range   B Natriuretic Peptide 403.0 (H) 0.0 - 100.0 pg/mL  Protime-INR     Status: Abnormal   Collection Time: 09/06/14  1:02 PM  Result Value Ref Range   Prothrombin Time 24.8 (H) 11.6 - 15.2 seconds   INR 2.27 (H) 0.00 - 1.49  Reticulocytes     Status: Abnormal   Collection Time: 09/06/14  1:03 PM  Result Value Ref Range   Retic Ct Pct 4.0 (H) 0.4 - 3.1 %   RBC. 2.13 (L) 3.87 - 5.11 MIL/uL   Retic Count, Manual 85.2 19.0 - 186.0 K/uL  Prepare RBC     Status: None   Collection Time: 09/06/14  1:57 PM  Result Value Ref Range   Order Confirmation ORDER PROCESSED BY BLOOD BANK   Type and screen     Status: None (Preliminary result)   Collection Time: 09/06/14  1:57 PM  Result Value Ref Range   ABO/RH(D) A NEG    Antibody Screen PENDING    Sample Expiration 09/09/2014     Radiological Exams on Admission: Dg Chest Port 1 View  09/06/2014   CLINICAL DATA:  Shortness of breath  EXAM: PORTABLE CHEST - 1 VIEW  COMPARISON:  06/21/2012  FINDINGS: Grossly unchanged enlarged cardiac silhouette and mediastinal contours given accentuated kyphotic projection. Pulmonary vasculature appears less distinct than present examination with cephalization of flow. Interval development small bilateral effusions with associated worsening heterogeneous/consolidative opacities, left greater than right. No  definite pneumothorax. Grossly unchanged bones.  IMPRESSION: Suspected development of pulmonary edema, bilateral effusions and associated bibasilar atelectasis on this portable kyphotic examination.   Electronically Signed   By: Sandi Mariscal M.D.   On: 09/06/2014 13:07     Assessment/Plan Principal Problem:   Acute respiratory failure Active Problems:   Acute exacerbation of CHF (congestive heart failure)   Atrial fibrillation with RVR   Hypothyroidism    Acute hypoxemic respiratory failure -On account of congestive heart failure. -Per minute oxygen support as necessary. -See below for details.  Acute CHF, type unknown -Patient has never had an echocardiogram in our system. -She is clinically showing signs of massive volume overload. -Strict intake and output, daily weights, Will place, Foley catheter to accurately manage fluid balance, will start her on Lasix 40 mg IV twice a day and adjust according to fluid balance. -Continue ACE inhibitor, add low-dose beta blocker. -Start aspirin.  Atrial fibrillation with rapid ventricular response -Patient has a history of this, is already on Coumadin for anticoagulation. -We'll add Coreg for rate control. -Was started on diltiazem IV in the ED which I will discontinue as she has now converted to sinus rhythm and as to not plummet her blood pressure to the point where I am unable to adequately diurese her. -Suspect a rapid ventricular responses in response to massive volume overload.  Anemia -Hemoglobin was found to be 6.1. -Have no prior baseline to compare.  -She is Hemoccult-negative. -Have requested anemia panel be drawn and then for her to be transfused 2 units of PRBCs. -Certainly her low hemoglobin may be contributing to both her rapid ventricular response with atrial fibrillation as well as her acute CHF exacerbation. -MCV is 102. ?B12/folate deficiency.  Hypothyroidism -Check TSH, continue home dose of Synthroid.  DVT prophylaxis -Is already fully anticoagulated on Coumadin.  CODE STATUS  -DO NOT RESUSCITATE as discussed with patient and daughter at bedside.   Time Spent on Admission: 95 minutes  [facility] Pager: [phone] 09/06/2014, 3:02  PM

## 2014-09-06 NOTE — ED Notes (Signed)
CRITICAL VALUE ALERT  Critical value received:  hemaglobin  Date of notification:  09/06/2014  Time of notification:  1330  Critical value read back:Yes.    Nurse who received alert:  Berdine Dance RN  MD notified (1st page):  Jeraldine Loots  Time of first page:  1330  MD notified (2nd page):  Time of second page:  Responding MD:  Jeraldine Loots  Time MD responded:  1330

## 2014-09-06 NOTE — ED Notes (Signed)
Per EMS, pt's sat's at home were 72 % on 3.25 liters of 02. Pt states she is more SOB today than usual

## 2014-09-06 NOTE — ED Notes (Signed)
Pt readjusted in bed. Pt cleaned of stool. Hemoccult negative. NAD

## 2014-09-06 NOTE — Progress Notes (Signed)
ANTICOAGULATION CONSULT NOTE - Initial Consult  Pharmacy Consult for Coumadin Indication: atrial fibrillation  Allergies  Allergen Reactions  . Codeine Nausea And Vomiting  . Morphine And Related Nausea And Vomiting  . Amoxicillin Nausea And Vomiting and Rash    Patient Measurements:   Heparin Dosing Weight:   Vital Signs: Temp: 98 F (36.7 C) (07/31 1515) Temp Source: Oral (07/31 1515) BP: 139/49 mmHg (07/31 1515) Pulse Rate: 82 (07/31 1515)  Labs:  Recent Labs  09/06/14 1302  HGB 6.1*  HCT 21.7*  PLT 104*  LABPROT 24.8*  INR 2.27*  CREATININE 0.78  TROPONINI 0.03    CrCl cannot be calculated (Unknown ideal weight.).   Medical History: Past Medical History  Diagnosis Date  . Atrial fibrillation   . CHF (congestive heart failure)   . Hypokalemia   . Hypothyroid     Medications:  Prescriptions prior to admission  Medication Sig Dispense Refill Last Dose  . ALPRAZolam (XANAX) 0.25 MG tablet Take 0.25 mg by mouth every 6 (six) hours as needed for sleep.   09/05/2014 at Unknown time  . benazepril (LOTENSIN) 10 MG tablet Take 10 mg by mouth daily.   09/06/2014 at Unknown time  . calcium-vitamin D (OSCAL WITH D) 500-200 MG-UNIT per tablet Take 1 tablet by mouth 3 (three) times daily.   09/06/2014 at Unknown time  . Ferrous Sulfate Dried (SLOW RELEASE IRON) 45 MG TBCR Take 2 tablets by mouth daily.   09/06/2014 at Unknown time  . furosemide (LASIX) 40 MG tablet Take 20 mg by mouth 2 (two) times daily. *may take an additional one-half to one whole tablet as needed associated with 5 pounds of weight gain   09/06/2014 at Unknown time  . levothyroxine (SYNTHROID, LEVOTHROID) 125 MCG tablet Take 125 mcg by mouth daily before breakfast.   09/06/2014 at Unknown time  . OVER THE COUNTER MEDICATION Place 1 drop into both eyes 2 (two) times daily. OTC eye drops for dry eyes   09/06/2014 at Unknown time  . potassium chloride (K-DUR,KLOR-CON) 10 MEQ tablet Take 20 mEq by mouth 2  (two) times daily.   09/06/2014 at Unknown time  . sodium chloride (OCEAN) 0.65 % nasal spray Place 2 sprays into the nose 2 (two) times daily.    09/06/2014 at Unknown time  . warfarin (COUMADIN) 5 MG tablet Take 7.5 mg by mouth every evening.    09/05/2014 at 1700    Assessment: Continuation of PTA Warfarin for AFIB INR therapeutic on admission  Goal of Therapy:  INR 2-3 Monitor platelets by anticoagulation protocol: Yes   Plan:  Coumadin 7.5 mg po x 1 dose today (home regiment) INR/PT daily Monitor CBC/platelets    Raylon Lamson Bennett 09/06/2014,3:58 PM

## 2014-09-06 NOTE — Progress Notes (Signed)
Pt transferred to ICU from ED. Pt is alert and oriented. VS are stable. Pt has no complaints at this time. Will begin first blood transfusion.

## 2014-09-06 NOTE — ED Provider Notes (Signed)
CSN: [id number]     Arrival date & time 09/06/14  1218 History   First MD Initiated Contact with Patient 09/06/14 1222     No chief complaint on file.    (Consider location/radiation/quality/duration/timing/severity/associated sxs/prior Treatment) Patient is a 81 y.o. female presenting with shortness of breath. The history is provided by the patient.  Shortness of Breath Severity:  Moderate Onset quality:  Gradual Duration:  2 weeks Timing:  Constant Progression:  Worsening Chronicity:  New Relieved by:  Nothing Worsened by:  Nothing tried Associated symptoms: no abdominal pain, no chest pain, no cough, no fever, no sputum production and no vomiting   Risk factors comment:  CHF   Past Medical History  Diagnosis Date  . Atrial fibrillation   . CHF (congestive heart failure)   . Hypokalemia   . Hypothyroid    Past Surgical History  Procedure Laterality Date  . Abdominal hysterectomy    . Bladder tack     No family history on file. History  Substance Use Topics  . Smoking status: Never Smoker   . Smokeless tobacco: Not on file  . Alcohol Use: No   OB History    No data available     Review of Systems  Constitutional: Positive for fatigue and unexpected weight change (weight gain). Negative for fever.  Respiratory: Positive for shortness of breath. Negative for cough and sputum production.   Cardiovascular: Positive for leg swelling. Negative for chest pain.  Gastrointestinal: Negative for vomiting and abdominal pain.  All other systems reviewed and are negative.     Allergies  Codeine; Morphine and related; and Amoxicillin  Home Medications   Prior to Admission medications   Medication Sig Start Date End Date Taking? Authorizing Provider  acetaminophen (TYLENOL) 500 MG tablet Take 500 mg by mouth every 6 (six) hours as needed for pain.    Historical Provider, MD  ALPRAZolam Prudy Feeler) 0.25 MG tablet Take 0.25 mg by mouth every 6 (six) hours as needed for  sleep.    Historical Provider, MD  benazepril (LOTENSIN) 10 MG tablet Take 10 mg by mouth daily.    Historical Provider, MD  calcium-vitamin D (OSCAL WITH D) 500-200 MG-UNIT per tablet Take 1 tablet by mouth 3 (three) times daily.    Historical Provider, MD  Ferrous Sulfate Dried (SLOW RELEASE IRON) 45 MG TBCR Take 2 tablets by mouth daily.    Historical Provider, MD  furosemide (LASIX) 40 MG tablet Take 20 mg by mouth 2 (two) times daily. *may take an additional one-half to one whole tablet as needed associated with 5 pounds of weight gain    Historical Provider, MD  levothyroxine (SYNTHROID, LEVOTHROID) 125 MCG tablet Take 125 mcg by mouth daily before breakfast.    Historical Provider, MD  OVER THE COUNTER MEDICATION Place 1 drop into both eyes 2 (two) times daily. OTC eye drops for dry eyes    Historical Provider, MD  potassium chloride (K-DUR,KLOR-CON) 10 MEQ tablet Take 20 mEq by mouth 2 (two) times daily.    Historical Provider, MD  sodium chloride (OCEAN) 0.65 % nasal spray Place 2 sprays into the nose 2 (two) times daily.     Historical Provider, MD  warfarin (COUMADIN) 5 MG tablet Take 7.5 mg by mouth every evening.     Historical Provider, MD   BP 112/63 mmHg  Pulse 117  Temp(Src) 97.8 F (36.6 C) (Oral)  Resp 26  SpO2 100% Physical Exam  Constitutional: She is oriented to person, place,  and time. She appears well-developed and well-nourished. No distress.  HENT:  Head: Normocephalic.  Eyes: Conjunctivae are normal.  Neck: Neck supple. No tracheal deviation present.  Cardiovascular: An irregularly irregular rhythm present. Tachycardia present.   Murmur heard.  Systolic murmur is present with a grade of 3/6  Pulmonary/Chest: Effort normal. No respiratory distress. She has rales (diffuse bilateral).  Abdominal: Soft. She exhibits no distension.  Left side abdominal wall edema  Musculoskeletal:       Left forearm: She exhibits edema (non-pitting).       Right lower leg: She  exhibits edema (4+ pitting).       Left lower leg: She exhibits edema (4+ pitting).  Neurological: She is alert and oriented to person, place, and time. GCS eye subscore is 4. GCS verbal subscore is 5. GCS motor subscore is 6.  Skin: Skin is warm and dry. No cyanosis.  Psychiatric: She has a normal mood and affect.    ED Course  Procedures (including critical care time) CRITICAL CARE Performed by: Lyndal Pulley Total critical care time: 30 Critical care time was exclusive of separately billable procedures and treating other patients. Critical care was necessary to treat or prevent imminent or life-threatening deterioration. Critical care was time spent personally by me on the following activities: development of treatment plan with patient and/or surrogate as well as nursing, discussions with consultants, evaluation of patient's response to treatment, examination of patient, obtaining history from patient or surrogate, ordering and performing treatments and interventions, ordering and review of laboratory studies, ordering and review of radiographic studies, pulse oximetry and re-evaluation of patient's condition.   Labs Review Labs Reviewed  COMPREHENSIVE METABOLIC PANEL - Abnormal; Notable for the following:    Chloride 91 (*)    CO2 40 (*)    Glucose, Bld 108 (*)    BUN 27 (*)    Albumin 2.8 (*)    ALT 9 (*)    All other components within normal limits  CBC WITH DIFFERENTIAL/PLATELET - Abnormal; Notable for the following:    RBC 2.12 (*)    Hemoglobin 6.1 (*)    HCT 21.7 (*)    MCV 102.4 (*)    MCHC 28.1 (*)    RDW 18.1 (*)    Platelets 104 (*)    Neutrophils Relative % 81 (*)    Lymphocytes Relative 7 (*)    Lymphs Abs 0.4 (*)    All other components within normal limits  BRAIN NATRIURETIC PEPTIDE - Abnormal; Notable for the following:    B Natriuretic Peptide 403.0 (*)    All other components within normal limits  URINALYSIS, ROUTINE W REFLEX MICROSCOPIC (NOT AT [facility]) -  Abnormal; Notable for the following:    Hgb urine dipstick MODERATE (*)    All other components within normal limits  PROTIME-INR - Abnormal; Notable for the following:    Prothrombin Time 24.8 (*)    INR 2.27 (*)    All other components within normal limits  URINE MICROSCOPIC-ADD ON - Abnormal; Notable for the following:    Squamous Epithelial / LPF MANY (*)    All other components within normal limits  TROPONIN I  VITAMIN B12  FOLATE  IRON AND TIBC  FERRITIN  RETICULOCYTES  POC OCCULT BLOOD, ED  PREPARE RBC (CROSSMATCH)  TYPE AND SCREEN    Imaging Review Dg Chest Port 1 View  09/06/2014   CLINICAL DATA:  Shortness of breath  EXAM: PORTABLE CHEST - 1 VIEW  COMPARISON:  06/21/2012  FINDINGS: Grossly unchanged enlarged cardiac silhouette and mediastinal contours given accentuated kyphotic projection. Pulmonary vasculature appears less distinct than present examination with cephalization of flow. Interval development small bilateral effusions with associated worsening heterogeneous/consolidative opacities, left greater than right. No definite pneumothorax. Grossly unchanged bones.  IMPRESSION: Suspected development of pulmonary edema, bilateral effusions and associated bibasilar atelectasis on this portable kyphotic examination.   Electronically Signed   By: Simonne Come M.D.   On: 09/06/2014 13:07   I independently viewed and interpreted the above radiology studies and agree with radiologist report.    EKG Interpretation   Date/Time:  Sunday September 06 2014 12:26:30 EDT Ventricular Rate:  105 PR Interval:    QRS Duration: 88 QT Interval:  375 QTC Calculation: 496 R Axis:   135 Text Interpretation:  Atrial fibrillation with rvr Probable right  ventricular hypertrophy Abnormal lateral Q waves Confirmed by Hadassah Rana MD,  Reuel Boom ([id number]) on 09/06/2014 12:52:51 PM      MDM   Final diagnoses:  Shortness of breath    81 year old female with history of congestive heart failure presents  with gradually worsening shortness of breath over the last 2 weeks. Her daughter is at bedside to provide most of the history. She states that she has had increased leg swelling and shortness of breath. She is on 3.5 L of oxygen at home normally and has required more. On arrival of EMS the patient was saturating in the 70s required some oxygen but recovered and we were able to decrease her oxygen requirement to just above her normal level. Chest x-ray is consistent with pulmonary edema, patient is in atrial fibrillation with RVR on arrival that was rate controlled with a diltiazem bolus and drip. At this time I believe decompensated heart failure may be due to ongoing rapid ventricular rate or could be secondary to fluid overload. Diuresis started with 40 mg of Lasix IV.  On screening labs patient was found to be anemic with a hemoglobin of 6.1 which is confounding diagnosis of acute systolic congestive heart failure. Type and screen and one unit of blood administered with plan to trend on an inpatient basis.  Hospitalist Dr Ardyth Harps was consulted for admission and will see the patient in the emergency department.   Lyndal Pulley, MD 09/06/14 [phone]

## 2014-09-07 ENCOUNTER — Inpatient Hospital Stay (HOSPITAL_COMMUNITY): Payer: Medicare Other

## 2014-09-07 DIAGNOSIS — J9600 Acute respiratory failure, unspecified whether with hypoxia or hypercapnia: Secondary | ICD-10-CM

## 2014-09-07 DIAGNOSIS — I509 Heart failure, unspecified: Secondary | ICD-10-CM

## 2014-09-07 DIAGNOSIS — E031 Congenital hypothyroidism without goiter: Secondary | ICD-10-CM

## 2014-09-07 LAB — BASIC METABOLIC PANEL
ANION GAP: 10 (ref 5–15)
BUN: 26 mg/dL — ABNORMAL HIGH (ref 6–20)
CO2: 39 mmol/L — ABNORMAL HIGH (ref 22–32)
Calcium: 9.6 mg/dL (ref 8.9–10.3)
Chloride: 93 mmol/L — ABNORMAL LOW (ref 101–111)
Creatinine, Ser: 0.72 mg/dL (ref 0.44–1.00)
GFR calc Af Amer: >60 mL/min (ref >60)
GFR calc non Af Amer: >60 mL/min (ref >60)
GLUCOSE: 101 mg/dL — AB (ref 65–99)
Potassium: 3.4 mmol/L — ABNORMAL LOW (ref 3.5–5.1)
Sodium: 142 mmol/L (ref 135–145)

## 2014-09-07 LAB — PROTIME-INR
INR: 2.52 — ABNORMAL HIGH (ref 0.00–1.49)
PROTHROMBIN TIME: 26.9 s — AB (ref 11.6–15.2)

## 2014-09-07 LAB — CBC
HEMATOCRIT: 27.8 % — AB (ref 36.0–46.0)
HEMOGLOBIN: 8.4 g/dL — AB (ref 12.0–15.0)
MCH: 29.4 pg (ref 26.0–34.0)
MCHC: 30.2 g/dL (ref 30.0–36.0)
MCV: 97.2 fL (ref 78.0–100.0)
PLATELETS: 90 10*3/uL — AB (ref 150–400)
RBC: 2.86 MIL/uL — ABNORMAL LOW (ref 3.87–5.11)
RDW: 17.6 % — ABNORMAL HIGH (ref 11.5–15.5)
WBC: 6 10*3/uL (ref 4.0–10.5)

## 2014-09-07 LAB — TROPONIN I: TROPONIN I: 0.03 ng/mL (ref <0.031)

## 2014-09-07 MED ORDER — POTASSIUM CHLORIDE 20 MEQ/15ML (10%) PO SOLN
20.0000 meq | Freq: Two times a day (BID) | ORAL | Status: DC
  Administered 2014-09-07 – 2014-09-12 (×10): 20 meq via ORAL
  Filled 2014-09-07 (×10): qty 30

## 2014-09-07 MED ORDER — WARFARIN SODIUM 5 MG PO TABS
5.0000 mg | ORAL_TABLET | Freq: Once | ORAL | Status: AC
  Administered 2014-09-07: 5 mg via ORAL
  Filled 2014-09-07: qty 1

## 2014-09-07 MED ORDER — WARFARIN - PHARMACIST DOSING INPATIENT
Status: DC
  Administered 2014-09-08: 16:00:00

## 2014-09-07 MED ORDER — HYDROCOD POLST-CPM POLST ER 10-8 MG/5ML PO SUER
5.0000 mL | Freq: Once | ORAL | Status: AC
  Filled 2014-09-07: qty 5

## 2014-09-07 NOTE — Progress Notes (Signed)
Patients BP 102/42 at this time and heart rate 67. Was documented that SBP was in 80's earlier today. Notified Dr. Ardyth Harps. WIll hold this dose of COreg.

## 2014-09-07 NOTE — Progress Notes (Signed)
[facility] PROGRESS NOTE  Lynn Jordan:[id number] DOB: 07/13/1925 DOA: 09/06/2014 PCP: Remus Loffler, PA-C  Assessment/Plan: Acute Hypoxemic Respiratory Failure -On account of acute CHF. -Continue to provide oxygen support as needed. -See below for details.  Acute CF, type unknown -ECHO pending. -Has massive volume overload at present. -Suspect Is and Os are inaccurate. Have discussed with RN. -Continue ACE-I and BB. -ASA. -Continue current lasix dose, may need to increase dose if not adequate diuresis over next 24 hours.  A Fib with RVR -Rate controlled at present. -Off cardizem drip. -Continue anticoagulation with coumadin.  Anemia -Was transfused 2 units of PRBCs. -B12/folate WNL. -No signs of GI Bleeding.  Hypothyroidism -Continue synthroid.  Code Status: DNR Family Communication: Patient only  Disposition Plan: Transfer to telemetry today.   Consultants:  None   Antibiotics:  None   Subjective: Feels improved, still fatigued and "very swollen"  Objective: Filed Vitals:   09/07/14 1000 09/07/14 1100 09/07/14 1132 09/07/14 1200  BP: 126/64 94/42  83/48  Pulse: 71 69  72  Temp:   98.2 F (36.8 C)   TempSrc:   Oral   Resp: Height:      Weight:      SpO2: 100% 100%  100%    Intake/Output Summary (Last 24 hours) at 09/07/14 1434 Last data filed at 09/07/14 1135  Gross per 24 hour  Intake 2025.49 ml  Output   2001 ml  Net  24.49 ml   Filed Weights   09/06/14 1520 09/06/14 1600 09/07/14 0608  Weight: 74.4 kg (164 lb 0.4 oz) 74.4 kg (164 lb 0.4 oz) 73.9 kg (162 lb 14.7 oz)    Exam:   General:  AA Ox3  Cardiovascular: RRR  Respiratory: CTA B  Abdomen: S/NT/ND/+BS  Extremities: Massive 4++ edema   Neurologic:  Moves all 4 spontaneously  Data Reviewed: Basic Metabolic Panel:  Recent Labs Lab 09/06/14 1302 09/07/14 0354  NA 141 142  K 3.7 3.4*  CL 91* 93*  CO2 40* 39*  GLUCOSE 108* 101*  BUN  27* 26*  CREATININE 0.78 0.72  CALCIUM 9.8 9.6   Liver Function Tests:  Recent Labs Lab 09/06/14 1302  AST 19  ALT 9*  ALKPHOS 77  BILITOT 1.2  PROT 6.5  ALBUMIN 2.8*   No results for input(s): LIPASE, AMYLASE in the last 168 hours. No results for input(s): AMMONIA in the last 168 hours. CBC:  Recent Labs Lab 09/06/14 1302 09/06/14 2143 09/07/14 0354  WBC 6.4 5.7 6.0  NEUTROABS 5.3  --   --   HGB 6.1* 8.5* 8.4*  HCT 21.7* 28.2* 27.8*  MCV 102.4* 98.6 97.2  PLT 104* 86* 90*   Cardiac Enzymes:  Recent Labs Lab 09/06/14 1302 09/06/14 1538 09/06/14 2143 09/07/14 0354  TROPONINI 0.03 0.03 0.03 0.03   BNP (last 3 results)  Recent Labs  09/06/14 1302  BNP 403.0*    ProBNP (last 3 results) No results for input(s): PROBNP in the last 8760 hours.  CBG: No results for input(s): GLUCAP in the last 168 hours.  Recent Results (from the past 240 hour(s))  MRSA PCR Screening     Status: None   Collection Time: 09/06/14  3:34 PM  Result Value Ref Range Status   MRSA by PCR NEGATIVE NEGATIVE Final    Comment:        The GeneXpert MRSA Assay (FDA approved for NASAL specimens only), is one component of a  comprehensive MRSA colonization surveillance program. It is not intended to diagnose MRSA infection nor to guide or monitor treatment for MRSA infections.      Studies: Dg Chest Port 1 View  09/06/2014   CLINICAL DATA:  Shortness of breath  EXAM: PORTABLE CHEST - 1 VIEW  COMPARISON:  06/21/2012  FINDINGS: Grossly unchanged enlarged cardiac silhouette and mediastinal contours given accentuated kyphotic projection. Pulmonary vasculature appears less distinct than present examination with cephalization of flow. Interval development small bilateral effusions with associated worsening heterogeneous/consolidative opacities, left greater than right. No definite pneumothorax. Grossly unchanged bones.  IMPRESSION: Suspected development of pulmonary edema, bilateral  effusions and associated bibasilar atelectasis on this portable kyphotic examination.   Electronically Signed   By: Simonne Come M.D.   On: 09/06/2014 13:07    Scheduled Meds: . antiseptic oral rinse  7 mL Mouth Rinse BID  . aspirin EC  81 mg Oral Daily  . benazepril  10 mg Oral Daily  . calcium-vitamin D  1 tablet Oral TID  . carvedilol  3.125 mg Oral BID WC  . feeding supplement (ENSURE ENLIVE)  237 mL Oral BID BM  . furosemide  40 mg Intravenous Q12H  . levothyroxine  125 mcg Oral QAC breakfast  . potassium chloride  20 mEq Oral BID  . sodium chloride  3 mL Intravenous Q12H  . warfarin  5 mg Oral Once  . [START ON 09/08/2014] Warfarin - Pharmacist Dosing Inpatient   Does not apply Q24H   Continuous Infusions:   Principal Problem:   Acute respiratory failure Active Problems:   Acute exacerbation of CHF (congestive heart failure)   Atrial fibrillation with RVR   Hypothyroidism    Time spent: 30 minutes. Greater than 50% of this time was spent in direct contact with the patient coordinating care.    Chaya Jan  [facility] Pager [phone]  If 7PM-7AM, please contact night-coverage at [email or web address], password [email or web address] 09/07/2014, 2:34 PM  LOS: 1 day

## 2014-09-07 NOTE — Progress Notes (Signed)
Called report to [phone] RN that will be getting patient in room 315

## 2014-09-07 NOTE — Care Management Note (Signed)
Case Management Note  Patient Details  Name: Lynn Jordan MRN: [id number] Date of Birth: 1925/03/03  Expected Discharge Date:  09/09/14               Expected Discharge Plan:  Home/Self Care  In-House Referral:  NA  Discharge planning Services  CM Consult  Post Acute Care Choice:  NA Choice offered to:  NA  DME Arranged:    DME Agency:     HH Arranged:    HH Agency:     Status of Service:  In process, will continue to follow  Medicare Important Message Given:    Date Medicare IM Given:    Medicare IM give by:    Date Additional Medicare IM Given:    Additional Medicare Important Message give by:     If discussed at Long Length of Stay Meetings, dates discussed:    Additional Comments: Pt is from home, her son and daughter in law live with her. The patient has 3 children who live nearby. Pt has 2 PD aids that care for her intermittently 7 days a week. The aids assist with medication management, person hygiene and food preporation. The patient is non-ambulatory, her aids pick up up for transfers and patient spends most of her time in her wheelchair or electric recliner, pt has a hospital bed also. Pt has home O2 through [facility], neb machine and home INR monitor. Pt plans to return home with resumptions of care by her aids. No home needs identified at this time, will cont to follow for DC planning.  Malcolm Metro, RN 09/07/2014, 10:41 AM

## 2014-09-07 NOTE — Progress Notes (Signed)
ANTICOAGULATION CONSULT NOTE  Pharmacy Consult for Coumadin Indication: atrial fibrillation  Allergies  Allergen Reactions  . Codeine Nausea And Vomiting  . Morphine And Related Nausea And Vomiting  . Amoxicillin Nausea And Vomiting and Rash   Patient Measurements: Height:  (162.6 cm) Weight: 162 lb 14.7 oz (73.9 kg) IBW/kg (Calculated) : 54.7  Vital Signs: Temp: 98.1 F (36.7 C) (08/01 0743) Temp Source: Oral (07/31 2356) BP: 119/60 mmHg (08/01 0800) Pulse Rate: 53 (08/01 0800)  Labs:  Recent Labs  09/06/14 1302 09/06/14 1538 09/06/14 2143 09/07/14 0354  HGB 6.1*  --  8.5* 8.4*  HCT 21.7*  --  28.2* 27.8*  PLT 104*  --  86* 90*  LABPROT 24.8*  --   --  26.9*  INR 2.27*  --   --  2.52*  CREATININE 0.78  --   --  0.72  TROPONINI 0.03 0.03 0.03 0.03   Estimated Creatinine Clearance: 48.8 mL/min (by C-G formula based on Cr of 0.72).  Medical History: Past Medical History  Diagnosis Date  . Atrial fibrillation   . CHF (congestive heart failure)   . Hypokalemia   . Hypothyroid     Medications:  Prescriptions prior to admission  Medication Sig Dispense Refill Last Dose  . ALPRAZolam (XANAX) 0.25 MG tablet Take 0.25 mg by mouth every 6 (six) hours as needed for sleep.   09/05/2014 at Unknown time  . benazepril (LOTENSIN) 10 MG tablet Take 10 mg by mouth daily.   09/06/2014 at Unknown time  . calcium-vitamin D (OSCAL WITH D) 500-200 MG-UNIT per tablet Take 1 tablet by mouth 3 (three) times daily.   09/06/2014 at Unknown time  . Ferrous Sulfate Dried (SLOW RELEASE IRON) 45 MG TBCR Take 2 tablets by mouth daily.   09/06/2014 at Unknown time  . furosemide (LASIX) 40 MG tablet Take 20 mg by mouth 2 (two) times daily. *may take an additional one-half to one whole tablet as needed associated with 5 pounds of weight gain   09/06/2014 at Unknown time  . levothyroxine (SYNTHROID, LEVOTHROID) 125 MCG tablet Take 125 mcg by mouth daily before breakfast.   09/06/2014 at Unknown  time  . OVER THE COUNTER MEDICATION Place 1 drop into both eyes 2 (two) times daily. OTC eye drops for dry eyes   09/06/2014 at Unknown time  . potassium chloride (K-DUR,KLOR-CON) 10 MEQ tablet Take 20 mEq by mouth 2 (two) times daily.   09/06/2014 at Unknown time  . sodium chloride (OCEAN) 0.65 % nasal spray Place 2 sprays into the nose 2 (two) times daily.    09/06/2014 at Unknown time  . warfarin (COUMADIN) 5 MG tablet Take 7.5 mg by mouth every evening.    09/05/2014 at 1700   Assessment: Continuation of PTA Warfarin for AFIB INR therapeutic, but trending up.  Acute CHF can elevate INR.  Goal of Therapy:  INR 2-3   Plan:  Coumadin 5 mg po x 1 dose today INR/PT daily  Lynn Jordan 09/07/2014,9:17 AM

## 2014-09-07 NOTE — Progress Notes (Signed)
Patient lying in bed. Alert and oriented. Family at bedside. No complaints voiced at this time.

## 2014-09-08 DIAGNOSIS — I10 Essential (primary) hypertension: Secondary | ICD-10-CM | POA: Diagnosis present

## 2014-09-08 DIAGNOSIS — I071 Rheumatic tricuspid insufficiency: Secondary | ICD-10-CM

## 2014-09-08 DIAGNOSIS — I270 Primary pulmonary hypertension: Secondary | ICD-10-CM

## 2014-09-08 DIAGNOSIS — E039 Hypothyroidism, unspecified: Secondary | ICD-10-CM

## 2014-09-08 DIAGNOSIS — D649 Anemia, unspecified: Secondary | ICD-10-CM | POA: Diagnosis present

## 2014-09-08 DIAGNOSIS — I340 Nonrheumatic mitral (valve) insufficiency: Secondary | ICD-10-CM

## 2014-09-08 DIAGNOSIS — J9601 Acute respiratory failure with hypoxia: Secondary | ICD-10-CM

## 2014-09-08 DIAGNOSIS — I4891 Unspecified atrial fibrillation: Secondary | ICD-10-CM

## 2014-09-08 DIAGNOSIS — R601 Generalized edema: Secondary | ICD-10-CM

## 2014-09-08 DIAGNOSIS — Z7901 Long term (current) use of anticoagulants: Secondary | ICD-10-CM

## 2014-09-08 DIAGNOSIS — I350 Nonrheumatic aortic (valve) stenosis: Secondary | ICD-10-CM

## 2014-09-08 LAB — CBC
HEMATOCRIT: 27.1 % — AB (ref 36.0–46.0)
HEMOGLOBIN: 7.9 g/dL — AB (ref 12.0–15.0)
MCH: 29.3 pg (ref 26.0–34.0)
MCHC: 29.2 g/dL — ABNORMAL LOW (ref 30.0–36.0)
MCV: 100.4 fL — ABNORMAL HIGH (ref 78.0–100.0)
PLATELETS: 100 10*3/uL — AB (ref 150–400)
RBC: 2.7 MIL/uL — ABNORMAL LOW (ref 3.87–5.11)
RDW: 17.5 % — AB (ref 11.5–15.5)
WBC: 7 10*3/uL (ref 4.0–10.5)

## 2014-09-08 LAB — T4, FREE: Free T4: 1.82 ng/dL — ABNORMAL HIGH (ref 0.61–1.12)

## 2014-09-08 LAB — PROTIME-INR
INR: 2.77 — ABNORMAL HIGH (ref 0.00–1.49)
Prothrombin Time: 28.8 seconds — ABNORMAL HIGH (ref 11.6–15.2)

## 2014-09-08 LAB — BASIC METABOLIC PANEL
ANION GAP: 6 (ref 5–15)
BUN: 30 mg/dL — ABNORMAL HIGH (ref 6–20)
CO2: 43 mmol/L — AB (ref 22–32)
CREATININE: 0.67 mg/dL (ref 0.44–1.00)
Calcium: 9.7 mg/dL (ref 8.9–10.3)
Chloride: 93 mmol/L — ABNORMAL LOW (ref 101–111)
Glucose, Bld: 91 mg/dL (ref 65–99)
Potassium: 3.6 mmol/L (ref 3.5–5.1)
Sodium: 142 mmol/L (ref 135–145)

## 2014-09-08 LAB — GLUCOSE, CAPILLARY: GLUCOSE-CAPILLARY: 123 mg/dL — AB (ref 65–99)

## 2014-09-08 MED ORDER — FUROSEMIDE 10 MG/ML IJ SOLN
80.0000 mg | Freq: Two times a day (BID) | INTRAMUSCULAR | Status: DC
  Administered 2014-09-08 – 2014-09-11 (×6): 80 mg via INTRAVENOUS
  Filled 2014-09-08 (×6): qty 8

## 2014-09-08 MED ORDER — WARFARIN SODIUM 5 MG PO TABS
5.0000 mg | ORAL_TABLET | Freq: Once | ORAL | Status: AC
  Administered 2014-09-08: 5 mg via ORAL
  Filled 2014-09-08: qty 1

## 2014-09-08 MED ORDER — BENAZEPRIL HCL 10 MG PO TABS: 5.0000 mg | ORAL_TABLET | Freq: Every day | ORAL | Status: DC

## 2014-09-08 MED ORDER — METOLAZONE 5 MG PO TABS
2.5000 mg | ORAL_TABLET | Freq: Once | ORAL | Status: AC
  Administered 2014-09-08: 2.5 mg via ORAL
  Filled 2014-09-08: qty 1

## 2014-09-08 MED ORDER — DILTIAZEM HCL 30 MG PO TABS
30.0000 mg | ORAL_TABLET | Freq: Two times a day (BID) | ORAL | Status: DC
  Administered 2014-09-11 – 2014-09-12 (×3): 30 mg via ORAL
  Filled 2014-09-08 (×7): qty 1

## 2014-09-08 NOTE — Consult Note (Addendum)
Reason for Consult:   CHF  Requesting Physician: [facility] Primary Cardiologist Dr Andee Lineman  HPI: This is a 81 y.o. female with a past medical history significant for CAF. She last saw a cardiologist (Dr Andee Lineman) in 2014 when she was hospitalized with CAP. At that time she was noted to be in AF and also found to be anemic (transfused). She has a past history if TIA and she ultimately was placed on Coumadin. She lives in her own home, her son and daughter in law live with her. They also have outside help as the pt does not ambulate. Despite her significant debilitation she has not been hospitalized since 2014.  She was admitted 09/06/14 with edema and dyspnea which have been getting worse for the past 3 weeks. In the ED she had CHF on CXR (BNP 400). She was also noted to be in AF with VR 120 and anemic withy a Hgb of 6.1. Since admission she has improved symptomatically although she has only diuresed 2 lbs (-600cc).   PMHx:  Past Medical History  Diagnosis Date  . Atrial fibrillation   . CHF (congestive heart failure)   . Hypokalemia   . Hypothyroid     Past Surgical History  Procedure Laterality Date  . Abdominal hysterectomy    . Bladder tack      SOCHx:  reports that she has never smoked. She does not have any smokeless tobacco history on file. She reports that she does not drink alcohol or use illicit drugs.  FAMHx: History reviewed. No pertinent family history.  ALLERGIES: Allergies  Allergen Reactions  . Codeine Nausea And Vomiting  . Morphine And Related Nausea And Vomiting  . Amoxicillin Nausea And Vomiting and Rash    ROS: Pertinent items are noted in HPI. no history of CAD, MI, or prior cath  HOME MEDICATIONS: Prior to Admission medications   Medication Sig Start Date End Date Taking? Authorizing Provider  ALPRAZolam (XANAX) 0.25 MG tablet Take 0.25 mg by mouth every 6 (six) hours as needed for sleep.   Yes Historical Provider, MD  benazepril  (LOTENSIN) 10 MG tablet Take 10 mg by mouth daily.   Yes Historical Provider, MD  calcium-vitamin D (OSCAL WITH D) 500-200 MG-UNIT per tablet Take 1 tablet by mouth 3 (three) times daily.   Yes Historical Provider, MD  Ferrous Sulfate Dried (SLOW RELEASE IRON) 45 MG TBCR Take 2 tablets by mouth daily.   Yes Historical Provider, MD  furosemide (LASIX) 40 MG tablet Take 20 mg by mouth 2 (two) times daily. *may take an additional one-half to one whole tablet as needed associated with 5 pounds of weight gain   Yes Historical Provider, MD  levothyroxine (SYNTHROID, LEVOTHROID) 125 MCG tablet Take 125 mcg by mouth daily before breakfast.   Yes Historical Provider, MD  OVER THE COUNTER MEDICATION Place 1 drop into both eyes 2 (two) times daily. OTC eye drops for dry eyes   Yes Historical Provider, MD  potassium chloride (K-DUR,KLOR-CON) 10 MEQ tablet Take 20 mEq by mouth 2 (two) times daily.   Yes Historical Provider, MD  sodium chloride (OCEAN) 0.65 % nasal spray Place 2 sprays into the nose 2 (two) times daily.    Yes Historical Provider, MD  warfarin (COUMADIN) 5 MG tablet Take 7.5 mg by mouth every evening.    Yes Historical Provider, MD    HOSPITAL MEDICATIONS: I have reviewed the patient's current medications.  VITALS: Blood pressure  103/54, pulse 71, temperature 97.5 F (36.4 C), temperature source Oral, resp. rate 16, height  (1.626 m), weight 162 lb 0.6 oz (73.5 kg), SpO2 99 %.  PHYSICAL EXAM: General appearance: alert, cooperative, no distress and pale, chronically ill appearing, on O2 Neck: no JVD and transmitted AS murmur Lungs: severe kyphosis, decreased breath sounds bilateraly, no rales Heart: irregularly irregular rhythm and 2/6 AS murmur AOV area and LSB Abdomen: soft, non-tender; bowel sounds normal; no masses,  no organomegaly Extremities: 2-3+ puffy LE edema Pulses: diminnished Skin: pale cool dry Neurologic: Grossly normal  LABS: Results for orders placed or performed  during the hospital encounter of 09/06/14 (from the past 24 hour(s))  Basic metabolic panel     Status: Abnormal   Collection Time: 09/08/14  6:38 AM  Result Value Ref Range   Sodium 142 135 - 145 mmol/L   Potassium 3.6 3.5 - 5.1 mmol/L   Chloride 93 (L) 101 - 111 mmol/L   CO2 43 (H) 22 - 32 mmol/L   Glucose, Bld 91 65 - 99 mg/dL   BUN 30 (H) 6 - 20 mg/dL   Creatinine, Ser 0.98 0.44 - 1.00 mg/dL   Calcium 9.7 8.9 - 11.9 mg/dL   GFR calc non Af Amer >60 >60 mL/min   GFR calc Af Amer >60 >60 mL/min   Anion gap 6 5 - 15  Protime-INR     Status: Abnormal   Collection Time: 09/08/14  6:38 AM  Result Value Ref Range   Prothrombin Time 28.8 (H) 11.6 - 15.2 seconds   INR 2.77 (H) 0.00 - 1.49  CBC     Status: Abnormal   Collection Time: 09/08/14  6:38 AM  Result Value Ref Range   WBC 7.0 4.0 - 10.5 K/uL   RBC 2.70 (L) 3.87 - 5.11 MIL/uL   Hemoglobin 7.9 (L) 12.0 - 15.0 g/dL   HCT 14.7 (L) 82.9 - 56.2 %   MCV 100.4 (H) 78.0 - 100.0 fL   MCH 29.3 26.0 - 34.0 pg   MCHC 29.2 (L) 30.0 - 36.0 g/dL   RDW 13.0 (H) 86.5 - 78.4 %   Platelets 100 (L) 150 - 400 K/uL  Glucose, capillary     Status: Abnormal   Collection Time: 09/08/14  8:22 AM  Result Value Ref Range   Glucose-Capillary 123 (H) 65 - 99 mg/dL    EKG: AF with CVR  IMAGING: Dg Chest Port 1 View  09/06/2014   CLINICAL DATA:  Shortness of breath  EXAM: PORTABLE CHEST - 1 VIEW  COMPARISON:  06/21/2012  FINDINGS: Grossly unchanged enlarged cardiac silhouette and mediastinal contours given accentuated kyphotic projection. Pulmonary vasculature appears less distinct than present examination with cephalization of flow. Interval development small bilateral effusions with associated worsening heterogeneous/consolidative opacities, left greater than right. No definite pneumothorax. Grossly unchanged bones.  IMPRESSION: Suspected development of pulmonary edema, bilateral effusions and associated bibasilar atelectasis on this portable kyphotic  examination.   Electronically Signed   By: Simonne Come M.D.   On: 09/06/2014 13:07    IMPRESSION: Principal Problem:   Acute respiratory failure Active Problems:   Acute exacerbation of CHF (congestive heart failure)   Atrial fibrillation with RVR   Anemia   Hypothyroidism   Essential hypertension   Chronic anticoagulation   RECOMMENDATION: Echo shows AS, massive RAE, and severe TR. Suspect recent decompensation multifactorial- AS progression, Rt heart failure, anemia (Hgb 6.1), and possible iatrogenic hyperthyroid state (TSH 0.127).  Currently on Lasix 80  mg IV BID. Repeat TSH, check free T4. Not sure she is a candidate for continued Coumadin Rx with anemia. MD to see.  Time Spent Directly with Patient: 45 minutes  Abelino Derrick [phone] beeper 09/08/2014, 12:10 PM   The patient was seen and examined, and I agree with the assessment and plan as documented above, with modifications as noted below. Pt admitted with acute hypoxemic respiratory failure and rapid atrial fibrillation, found to be in acute combined decompensated diastolic and right heart failure. Has had some symptomatic improvement with IV diuresis but not a significant change in weight. Also anemic and on chronic anticoagulation therapy with warfarin.  I reviewed the echocardiogram today which showed severe aortic stenosis and severe tricuspid regurgitation. Severe aortic stenosis has led to high ventricular end-diastolic filling pressures with consequent moderate mitral regurgitation and severe pulmonary hypertension with consequent RV failure.  She is hypotensive. I will stop both Coreg and benazepril. For atrial fibrillation HR control, I will start short-acting diltiazem 30 mg bid which can be increased as needed. Will continue IV Lasix for now and give one dose of metolazone 2.5 mg today and reassess urinary output on 8/3.  Given her anemia, I feel she is a poor candidate for ongoing therapy with warfarin and ASA.  In spite of negative FOBT, she likely has a GI bleed.  She is a very poor candidate for surgical management and I suspect she will have repeated hospitalizations until she succumbs to her disease with eventual LV systolic dysfunction and failure. I have discussed this with the patient and family members present in room. I will consult palliative care.

## 2014-09-08 NOTE — Progress Notes (Addendum)
[facility] PROGRESS NOTE  Lynn Jordan ZOX:[id number] DOB: 1925-12-19 DOA: 09/06/2014 PCP: Remus Loffler, PA-C  Brief HPI: Patient is an 81 year old woman admitted on 09/06/2014 with shortness of breath and lower extremity edema. She has a history of atrial fibrillation, hypothyroidism and CHF of unknown type who presented with increased shortness of breath and massive edema. She has failed to adequately diurese despite 40 mg of Lasix twice daily. Today I will increase Lasix to 80 mg twice daily, will request cardiology assistance. Echocardiogram has been done today but results are still pending. She also initially had a hemoglobin of 6.1, was transfused 2 units. Poor overall prognosis.  Assessment/Plan: Acute Hypoxemic Respiratory Failure -On account of acute CHF. -Continue to provide oxygen support as needed. -See below for details.  Acute CHF, type unknown -ECHO done with results pending. -Has massive volume overload at present. -Continue ACE-I and BB. -ASA. -Will increase lasix to 80 mg BID. -Will request Cardiology assistance with management.   A Fib with RVR -Rate controlled at present. -Off cardizem drip.  -Coreg was started evening dose was held due to Bradycardia. -Continue anticoagulation with coumadin.  -INR therapeutic.  Macrocytic Anemia -Was transfused 2 units of PRBCs. -B12/folate WNL. -No signs of GI Bleeding. -Hgb down to 7.9 today. Will follow-up closely. She may need repeat transfusion.   Hypothyroidism -Continue synthroid.  Code Status: DNR DVT prophylaxis: Warfarin Family Communication: Patient only  Disposition Plan: To be determined may need SNF.   Consultants:  Cardiology  Antibiotics:  None   Subjective: Patient reports feeling better but sleepy. Denies shortness of breath or chest pain. She has not been able to be ambulatory since admission.  Objective: Filed Vitals:   09/07/14 1804 09/07/14 2200 09/08/14 0638 09/08/14  0700  BP: 102/42 108/56 103/54   Pulse: 67 86 67   Temp:  98.2 F (36.8 C) 97.5 F (36.4 C)   TempSrc:  Oral Oral   Resp:  20 20   Height:      Weight:    73.5 kg (162 lb 0.6 oz)  SpO2:  100% 100%     Intake/Output Summary (Last 24 hours) at 09/08/14 0806 Last data filed at 09/08/14 0500  Gross per 24 hour  Intake    240 ml  Output   1100 ml  Net   -860 ml   Filed Weights   09/06/14 1600 09/07/14 0608 09/08/14 0700  Weight: 74.4 kg (164 lb 0.4 oz) 73.9 kg (162 lb 14.7 oz) 73.5 kg (162 lb 0.6 oz)    Exam:   General:  Elderly frail looking woman, kyphotic  Cardiovascular: Irregular rate. Strong systolic murmur  Respiratory: Clear to ausculation bilateral. No wheezes, rales.  Abdomen: Soft, non tender, non distented, normal bowel sound.   Extremities: Massive 4+ pedal edema bilateral. Clean dressing on right big toe. Chronic venous insufficiency skin color  changes. Edema radiates to waist.  Neurologic:  Moves all 4 spontaneously. Grossly intact.  Data Reviewed: Basic Metabolic Panel:  Recent Labs Lab 09/06/14 1302 09/07/14 0354 09/08/14 0638  NA 141 142 142  K 3.7 3.4* 3.6  CL 91* 93* 93*  CO2 40* 39* 43*  GLUCOSE 108* 101* 91  BUN 27* 26* 30*  CREATININE 0.78 0.72 0.67  CALCIUM 9.8 9.6 9.7   Liver Function Tests:  Recent Labs Lab 09/06/14 1302  AST 19  ALT 9*  ALKPHOS 77  BILITOT 1.2  PROT 6.5  ALBUMIN 2.8*   No results for  input(s): LIPASE, AMYLASE in the last 168 hours. No results for input(s): AMMONIA in the last 168 hours. CBC:  Recent Labs Lab 09/06/14 1302 09/06/14 2143 09/07/14 0354 09/08/14 0638  WBC 6.4 5.7 6.0 7.0  NEUTROABS 5.3  --   --   --   HGB 6.1* 8.5* 8.4* 7.9*  HCT 21.7* 28.2* 27.8* 27.1*  MCV 102.4* 98.6 97.2 100.4*  PLT 104* 86* 90* 100*   Cardiac Enzymes:  Recent Labs Lab 09/06/14 1302 09/06/14 1538 09/06/14 2143 09/07/14 0354  TROPONINI 0.03 0.03 0.03 0.03   BNP (last 3 results)  Recent Labs   09/06/14 1302  BNP 403.0*    ProBNP (last 3 results) No results for input(s): PROBNP in the last 8760 hours.  CBG: No results for input(s): GLUCAP in the last 168 hours.  Recent Results (from the past 240 hour(s))  MRSA PCR Screening     Status: None   Collection Time: 09/06/14  3:34 PM  Result Value Ref Range Status   MRSA by PCR NEGATIVE NEGATIVE Final    Comment:        The GeneXpert MRSA Assay (FDA approved for NASAL specimens only), is one component of a comprehensive MRSA colonization surveillance program. It is not intended to diagnose MRSA infection nor to guide or monitor treatment for MRSA infections.      Studies: Dg Chest Port 1 View  09/06/2014   CLINICAL DATA:  Shortness of breath  EXAM: PORTABLE CHEST - 1 VIEW  COMPARISON:  06/21/2012  FINDINGS: Grossly unchanged enlarged cardiac silhouette and mediastinal contours given accentuated kyphotic projection. Pulmonary vasculature appears less distinct than present examination with cephalization of flow. Interval development small bilateral effusions with associated worsening heterogeneous/consolidative opacities, left greater than right. No definite pneumothorax. Grossly unchanged bones.  IMPRESSION: Suspected development of pulmonary edema, bilateral effusions and associated bibasilar atelectasis on this portable kyphotic examination.   Electronically Signed   By: Simonne Come M.D.   On: 09/06/2014 13:07    Scheduled Meds: . antiseptic oral rinse  7 mL Mouth Rinse BID  . aspirin EC  81 mg Oral Daily  . benazepril  10 mg Oral Daily  . calcium-vitamin D  1 tablet Oral TID  . carvedilol  3.125 mg Oral BID WC  . chlorpheniramine-HYDROcodone  5 mL Oral Once  . feeding supplement (ENSURE ENLIVE)  237 mL Oral BID BM  . furosemide  40 mg Intravenous Q12H  . levothyroxine  125 mcg Oral QAC breakfast  . potassium chloride  20 mEq Oral BID  . sodium chloride  3 mL Intravenous Q12H  . Warfarin - Pharmacist Dosing Inpatient    Does not apply Q24H   Continuous Infusions:   Principal Problem:   Acute respiratory failure Active Problems:   Acute exacerbation of CHF (congestive heart failure)   Atrial fibrillation with RVR   Hypothyroidism    Time spent: 35 minutes. Greater than 50% of this time was spent in direct contact with the patient coordinating care.    Dr. Peggye Pitt, MD  [facility] Pager [phone]  If 7PM-7AM, please contact night-coverage at [email or web address], password [facility] 09/08/2014, 8:06 AM  LOS: 2 days     I, Jessica D. Jari Pigg, acting as scribe, recorded this note contemporaneously in the presence of Dr. Chaya Jan, M.D. on 09/08/2014 at 8:07 AM      I have reviewed the above documentation for accuracy and completeness, and I agree with the above.  Peggye Pitt, MD [facility]  Pager: [phone]

## 2014-09-08 NOTE — Progress Notes (Signed)
ANTICOAGULATION CONSULT NOTE  Pharmacy Consult for Coumadin Indication: atrial fibrillation  Allergies  Allergen Reactions  . Codeine Nausea And Vomiting  . Morphine And Related Nausea And Vomiting  . Amoxicillin Nausea And Vomiting and Rash   Patient Measurements: Height:  (162.6 cm) Weight: 162 lb 0.6 oz (73.5 kg) IBW/kg (Calculated) : 54.7  Vital Signs: Temp: 97.5 F (36.4 C) (08/02 0638) Temp Source: Oral (08/02 5409) BP: 103/54 mmHg (08/02 8119) Pulse Rate: 71 (08/02 0825)  Labs:  Recent Labs  09/06/14 1302 09/06/14 1538 09/06/14 2143 09/07/14 0354 09/08/14 0638  HGB 6.1*  --  8.5* 8.4* 7.9*  HCT 21.7*  --  28.2* 27.8* 27.1*  PLT 104*  --  86* 90* 100*  LABPROT 24.8*  --   --  26.9* 28.8*  INR 2.27*  --   --  2.52* 2.77*  CREATININE 0.78  --   --  0.72 0.67  TROPONINI 0.03 0.03 0.03 0.03  --    Estimated Creatinine Clearance: 48.6 mL/min (by C-G formula based on Cr of 0.67).  Medical History: Past Medical History  Diagnosis Date  . Atrial fibrillation   . CHF (congestive heart failure)   . Hypokalemia   . Hypothyroid    Medications:  Prescriptions prior to admission  Medication Sig Dispense Refill Last Dose  . ALPRAZolam (XANAX) 0.25 MG tablet Take 0.25 mg by mouth every 6 (six) hours as needed for sleep.   09/05/2014 at Unknown time  . benazepril (LOTENSIN) 10 MG tablet Take 10 mg by mouth daily.   09/06/2014 at Unknown time  . calcium-vitamin D (OSCAL WITH D) 500-200 MG-UNIT per tablet Take 1 tablet by mouth 3 (three) times daily.   09/06/2014 at Unknown time  . Ferrous Sulfate Dried (SLOW RELEASE IRON) 45 MG TBCR Take 2 tablets by mouth daily.   09/06/2014 at Unknown time  . furosemide (LASIX) 40 MG tablet Take 20 mg by mouth 2 (two) times daily. *may take an additional one-half to one whole tablet as needed associated with 5 pounds of weight gain   09/06/2014 at Unknown time  . levothyroxine (SYNTHROID, LEVOTHROID) 125 MCG tablet Take 125 mcg by  mouth daily before breakfast.   09/06/2014 at Unknown time  . OVER THE COUNTER MEDICATION Place 1 drop into both eyes 2 (two) times daily. OTC eye drops for dry eyes   09/06/2014 at Unknown time  . potassium chloride (K-DUR,KLOR-CON) 10 MEQ tablet Take 20 mEq by mouth 2 (two) times daily.   09/06/2014 at Unknown time  . sodium chloride (OCEAN) 0.65 % nasal spray Place 2 sprays into the nose 2 (two) times daily.    09/06/2014 at Unknown time  . warfarin (COUMADIN) 5 MG tablet Take 7.5 mg by mouth every evening.    09/05/2014 at 1700   Assessment: Continuation of PTA Warfarin for AFIB INR therapeutic, but trending up.  Acute CHF can elevate INR.  Goal of Therapy:  INR 2-3   Plan:  Coumadin 5 mg po x 1 dose today  INR/PT daily  Lynn Jordan 09/08/2014,11:54 AM

## 2014-09-08 NOTE — Progress Notes (Signed)
Minimal urine output despite aggressive IV diuretics. MD made aware. Bleeding and skin break down in groin area-- Interdry ordered. Excoriated sacrum--Pink foam dressing; Q2 turns and barrier cream applied to areas outside sacral foam.

## 2014-09-09 DIAGNOSIS — Z515 Encounter for palliative care: Secondary | ICD-10-CM

## 2014-09-09 DIAGNOSIS — Z7189 Other specified counseling: Secondary | ICD-10-CM | POA: Insufficient documentation

## 2014-09-09 DIAGNOSIS — I9589 Other hypotension: Secondary | ICD-10-CM

## 2014-09-09 DIAGNOSIS — D509 Iron deficiency anemia, unspecified: Secondary | ICD-10-CM

## 2014-09-09 DIAGNOSIS — I5031 Acute diastolic (congestive) heart failure: Secondary | ICD-10-CM

## 2014-09-09 LAB — BASIC METABOLIC PANEL
Anion gap: 5 (ref 5–15)
BUN: 37 mg/dL — AB (ref 6–20)
CO2: 44 mmol/L — AB (ref 22–32)
CREATININE: 0.78 mg/dL (ref 0.44–1.00)
Calcium: 9.9 mg/dL (ref 8.9–10.3)
Chloride: 92 mmol/L — ABNORMAL LOW (ref 101–111)
GFR calc non Af Amer: >60 mL/min (ref >60)
Glucose, Bld: 92 mg/dL (ref 65–99)
POTASSIUM: 4 mmol/L (ref 3.5–5.1)
SODIUM: 141 mmol/L (ref 135–145)

## 2014-09-09 LAB — TSH: TSH: 0.154 u[IU]/mL — ABNORMAL LOW (ref 0.350–4.500)

## 2014-09-09 LAB — CBC
HCT: 27.8 % — ABNORMAL LOW (ref 36.0–46.0)
HEMOGLOBIN: 8 g/dL — AB (ref 12.0–15.0)
MCH: 29.7 pg (ref 26.0–34.0)
MCHC: 28.8 g/dL — ABNORMAL LOW (ref 30.0–36.0)
MCV: 103.3 fL — ABNORMAL HIGH (ref 78.0–100.0)
PLATELETS: 91 10*3/uL — AB (ref 150–400)
RBC: 2.69 MIL/uL — ABNORMAL LOW (ref 3.87–5.11)
RDW: 17.5 % — ABNORMAL HIGH (ref 11.5–15.5)
WBC: 7 10*3/uL (ref 4.0–10.5)

## 2014-09-09 LAB — PROTIME-INR
INR: 2.69 — AB (ref 0.00–1.49)
Prothrombin Time: 28.2 seconds — ABNORMAL HIGH (ref 11.6–15.2)

## 2014-09-09 MED ORDER — METOLAZONE 5 MG PO TABS
5.0000 mg | ORAL_TABLET | Freq: Once | ORAL | Status: AC
  Administered 2014-09-09: 5 mg via ORAL
  Filled 2014-09-09: qty 1

## 2014-09-09 MED ORDER — ALPRAZOLAM 0.25 MG PO TABS
0.2500 mg | ORAL_TABLET | Freq: Every day | ORAL | Status: DC
  Administered 2014-09-09 – 2014-09-11 (×3): 0.25 mg via ORAL
  Filled 2014-09-09 (×3): qty 1

## 2014-09-09 MED ORDER — ALPRAZOLAM 0.25 MG PO TABS: 0.2500 mg | ORAL_TABLET | Freq: Every day | ORAL | Status: DC | PRN

## 2014-09-09 NOTE — Progress Notes (Signed)
ANTICOAGULATION NOTE:  Pt has been on Coumadin.    Discussed with Dr Purvis Sheffield from cardiology, Coumadin discontinued due to anemia and probable GI bleed, poor candidate.    Pharmacy has signed off, thank you for allowing pharmacy to participate in her care.  Valrie Hart, PharmD

## 2014-09-09 NOTE — Consult Note (Signed)
Consultation Note Date: 09/09/2014   Patient Name: Lynn Jordan  DOB: 16-Jun-1925  MRN: [id number]  Age / Sex: 81 y.o., female   PCP: Remus Loffler, PA-C Referring Physician: Standley Brooking, MD  Reason for Consultation: Establishing goals of care and Psychosocial/spiritual support  Palliative Care Assessment and Plan Summary of Established Goals of Care and Medical Treatment Preferences   Clinical Assessment/Narrative: Mrs. Rann looks frail and elderly lying in bed.  Her daughter, Junita Push, is at her bedside.  Mrs. Henney asks that I speak with her daughter.  Ms. Val Eagle and I talk about Mrs. Fulchers functional status, (she hasn't walked in the last 2 years, after a PNE), she is picked up for transfer, unable to bear her weight, unable to perform ADL's, except she can feed herself with set-up.  We talk about mechanical soft diet as Mrs. Bump will, at times choke on foods.   The chronic illness trajectory diagram is shared, including discussion of repeat hospitalizations.      We talk about CHF exacerbations, including medications, limiting salt, and fluid restrictions.  We talk about the difficulty in getting out of the home.   Their PCP, Dr. Charm Barges, comes to the home every 6 weeks or so.   Ms. Val Eagle states that Mrs. Roesler has stated that she doesn't want to go to SNF and her preference is to stay at home.  We talk about people deciding to not come back to the hospital, and Ms. Lemons states Ms. Cho didn't want to come this time, but they had no choice.   Ms. Val Eagle talk about her father's death from brain cancer, and how he came home to die with Hospice, ( less than 1 week).   We talk about Mrs. Fulchers stated desire to die at home, and what this will look like with CHF, including the need for support/Hospice at that time.     Ms. Val Eagle states she isn't ready to make any decisions at this time, and she was reassured that she is not being called to choose Hospice.  That  the medical team is here to help her understand what is going on with her mother and help them in this time.  She states her main worry is "her condition ", and we talk about the realities of end of life with CHF.   Ms. Val Eagle states that she wants to think about what we have discussed, and the best way to talk with her mother about this, stating... she needs to tell her this is more support to help them, and doesn't mean she should just give up.    Contacts/Participants in Discussion: Primary Decision Maker: Junita Push makes decisions for her mother. She has an older and younger brother.    HCPOA: yes  Daughter, Junita Push  Code Status/Advance Care Planning:  DNR/DNI  Concepts of do not re hospitalize and Hospice discussed.   Symptom Management:   Anxiety managed with xanax 0.25 mg PO Q 6 hours PRN, helpful at night for sleep.   Palliative Prophylaxis: No bowel regimen noted. Suggest Senna-S 1-2 tabs QD PRN.   Psycho-social/Spiritual:   Support System: Mrs. Emigh lives with her son (on dialysis) and daughter in law, and they have paid caregivers daily.   Desire for further Chaplaincy support: Not discussed today.   Prognosis: < 6 months  Discharge Planning:  Home with Home Health/vs Hospice        Chief Complaint:  SOB, LE swelling History of Present  Illness: 81 y/o woman with history significant for CHF, atrial fibrillation and hypothyroidism who presents to the hospital today with the above complaints. Daughter at bedside provides most of the history and states that for the past 6 days she has had increasing lower extremity swelling. Today her caregiver became concerned because in the days that she had not seen the patient the swelling was a lot worse as well as increased shortness of breath to the point where they had to increase her oxygen from her typical 20 half to 3-1/2. Upon arrival of EMS she was noted to be in the 70s on 3.5 L. She was also noted to be in atrial  fibrillation with rapid ventricular response. On arrival to the ED, she was started on a Cardizem drip, she was noted to have a hemoglobin of 6.1, chest x-ray showed pulmonary edema, we're asked to admit her for further evaluation and management.  She has not diuresed as well as expected.  Cardiology and Palliative consults.   Primary Diagnoses  Present on Admission:  . Acute respiratory failure . Atrial fibrillation with RVR . Hypothyroidism . Anemia . Essential hypertension  Palliative Review of Systems: Mrs. Borger denies pain, anxiety, dyspnea, N/V.  I have reviewed the medical record, interviewed the patient and family, and examined the patient. The following aspects are pertinent.  Past Medical History  Diagnosis Date  . Atrial fibrillation   . CHF (congestive heart failure)   . Hypokalemia   . Hypothyroid    History   Social History  . Marital Status: Married    Spouse Name: N/A  . Number of Children: N/A  . Years of Education: N/A   Social History Main Topics  . Smoking status: Never Smoker   . Smokeless tobacco: Not on file  . Alcohol Use: No  . Drug Use: No  . Sexual Activity: Not on file   Other Topics Concern  . None   Social History Narrative   History reviewed. No pertinent family history. Scheduled Meds: . antiseptic oral rinse  7 mL Mouth Rinse BID  . calcium-vitamin D  1 tablet Oral TID  . diltiazem  30 mg Oral Q12H  . feeding supplement (ENSURE ENLIVE)  237 mL Oral BID BM  . furosemide  80 mg Intravenous Q12H  . levothyroxine  125 mcg Oral QAC breakfast  . metolazone  5 mg Oral Once  . potassium chloride  20 mEq Oral BID  . sodium chloride  3 mL Intravenous Q12H  . Warfarin - Pharmacist Dosing Inpatient   Does not apply Q24H   Continuous Infusions:  PRN Meds:.sodium chloride, acetaminophen, ALPRAZolam, ondansetron (ZOFRAN) IV, sodium chloride Medications Prior to Admission:  Prior to Admission medications   Medication Sig Start Date End  Date Taking? Authorizing Provider  ALPRAZolam (XANAX) 0.25 MG tablet Take 0.25 mg by mouth every 6 (six) hours as needed for sleep.   Yes Historical Provider, MD  benazepril (LOTENSIN) 10 MG tablet Take 10 mg by mouth daily.   Yes Historical Provider, MD  calcium-vitamin D (OSCAL WITH D) 500-200 MG-UNIT per tablet Take 1 tablet by mouth 3 (three) times daily.   Yes Historical Provider, MD  Ferrous Sulfate Dried (SLOW RELEASE IRON) 45 MG TBCR Take 2 tablets by mouth daily.   Yes Historical Provider, MD  furosemide (LASIX) 40 MG tablet Take 20 mg by mouth 2 (two) times daily. *may take an additional one-half to one whole tablet as needed associated with 5 pounds of weight gain  Yes Historical Provider, MD  levothyroxine (SYNTHROID, LEVOTHROID) 125 MCG tablet Take 125 mcg by mouth daily before breakfast.   Yes Historical Provider, MD  OVER THE COUNTER MEDICATION Place 1 drop into both eyes 2 (two) times daily. OTC eye drops for dry eyes   Yes Historical Provider, MD  potassium chloride (K-DUR,KLOR-CON) 10 MEQ tablet Take 20 mEq by mouth 2 (two) times daily.   Yes Historical Provider, MD  sodium chloride (OCEAN) 0.65 % nasal spray Place 2 sprays into the nose 2 (two) times daily.    Yes Historical Provider, MD  warfarin (COUMADIN) 5 MG tablet Take 7.5 mg by mouth every evening.    Yes Historical Provider, MD   Allergies  Allergen Reactions  . Codeine Nausea And Vomiting  . Morphine And Related Nausea And Vomiting  . Amoxicillin Nausea And Vomiting and Rash   CBC:    Component Value Date/Time   WBC 7.0 09/09/2014 0543   HGB 8.0* 09/09/2014 0543   HCT 27.8* 09/09/2014 0543   PLT 91* 09/09/2014 0543   MCV 103.3* 09/09/2014 0543   NEUTROABS 5.3 09/06/2014 1302   LYMPHSABS 0.4* 09/06/2014 1302   MONOABS 0.6 09/06/2014 1302   EOSABS 0.1 09/06/2014 1302   BASOSABS 0.0 09/06/2014 1302   Comprehensive Metabolic Panel:    Component Value Date/Time   NA 141 09/09/2014 0543   K 4.0 09/09/2014  0543   CL 92* 09/09/2014 0543   CO2 44* 09/09/2014 0543   BUN 37* 09/09/2014 0543   CREATININE 0.78 09/09/2014 0543   GLUCOSE 92 09/09/2014 0543   CALCIUM 9.9 09/09/2014 0543   AST 19 09/06/2014 1302   ALT 9* 09/06/2014 1302   ALKPHOS 77 09/06/2014 1302   BILITOT 1.2 09/06/2014 1302   PROT 6.5 09/06/2014 1302   ALBUMIN 2.8* 09/06/2014 1302    Physical Exam: Vital Signs: BP 92/44 mmHg  Pulse 75  Temp(Src) 97.8 F (36.6 C) (Oral)  Resp 16  Ht 5\' 4"  (1.626 m)  Wt 73.7 kg (162 lb 7.7 oz)  BMI 27.88 kg/m2  SpO2 99% SpO2: SpO2: 99 % O2 Device: O2 Device: Nasal Cannula O2 Flow Rate: O2 Flow Rate (L/min): 3 L/min Intake/output summary:  Intake/Output Summary (Last 24 hours) at 09/09/14 1149 Last data filed at 09/09/14 0956  Gross per 24 hour  Intake    640 ml  Output   1675 ml  Net  -1035 ml   LBM: Last BM Date: 09/08/14 Baseline Weight: Weight: 74.4 kg (164 lb 0.4 oz) Most recent weight: Weight: 73.7 kg (162 lb 7.7 oz)  Exam Findings:  Constitutional:  Frail elderly, lying in bed, makes but not keep eye contact.  Resp: even and non labored GI:  Soft non tender Extremities: 3+ pitting edema to bl lower legs,  Musc:  Generalized weakness, bl leg strength 1/5;  Marked kyphosis.          Palliative Performance Scale: 30%              Additional Data Reviewed: Recent Labs     09/08/14  0638  09/09/14  0543  WBC  7.0  7.0  HGB  7.9*  8.0*  PLT  100*  91*  NA  142  141  BUN  30*  37*  CREATININE  0.67  0.78     Time In: 0930  Time Out: 1100 Time Total: 90 minutes.  Greater than 50%  of this time was spent counseling and coordinating care related to the above  assessment and plan. GOC discussion shared with nursing staff, CM, SW, and Dr. Irene Limbo.   Signed by: Katheran Awe, NP  Katheran Awe, NP  09/09/2014, 11:49 AM  Please contact Palliative Medicine Team phone at [phone] for questions and concerns.

## 2014-09-09 NOTE — Progress Notes (Signed)
Washed and dried both sides of groin area. Intra-Dry placed.

## 2014-09-09 NOTE — Progress Notes (Signed)
PROGRESS NOTE  Lynn Jordan:[id number] DOB: 05-Aug-1925 DOA: 09/06/2014 PCP: Remus Loffler, PA-C  Summary: 81 yo female with history of atrial fibrillation, hypothyroidism and CHF of unknown type presented with complaints of shortness of breath and lower extremity edema.  Assessment/Plan: 1. Acute on chronic hypoxic respiratory failure. Improved. Plan to wean down oxygen to baseline requirement of 2L 2. A Fib with RVR, now rate-controlled. Continue diltiazem. 3. Macrocytic anemia. Stable s/p 2 units PRBC. No workup per family wishes.  4. Acute diastolic heart failure, severe aortic stenosis, severe TR, severe PHTN, with consequent RV failure with significant bilateral LE edema. 5. Thrombocytopenia, stable. Etiology unclear present on admission. Given family, no further evaluation planned. 6. Hypothyroidism. TSH slightly low. T4 slightly high. Recommend recheck as outpatient.    Overall feels better, plan to continue diuresis.   Cardiology recommends stopping Coreg and ACE-I and starting diltiazem. Will continue Lasix. She is not a candidate for surgery.   Xanax at night, available as needed during the day.   PMT consult pending.   Code Status: DNR DVT prophylaxis: Warfarin Family Communication: Daughter (POA) bedside. Disposition Plan: 48 hours pending improvement.   Brendia Sacks, MD  [facility]  Pager [phone] If 7PM-7AM, please contact night-coverage at [email or web address], password [facility] 09/09/2014, 6:59 AM  LOS: 3 days   Consultants:  Cardiology  Procedures:  Transfusion 2 units PRBC  Echo reveals: - Left ventricle: The cavity size was normal. There was mild concentric hypertrophy. Systolic function was normal. The estimated ejection fraction was in the range of 60% to 65%. The study was not technically sufficient to allow evaluation of LV diastolic dysfunction due to atrial fibrillation. However, in context of severe aortic stenosis with  biatrial enlargement and pulmonary hypertension, findings strongly suggestive of diastolic dysfunction. Doppler parameters are consistent with both elevated ventricular end-diastolic filling pressure and elevated left atrial filling pressure. Ratio of mitral valve peak E velocity to medial annulus E (e&') velocity: 20.61. - Aortic valve: Moderately to severely calcified annulus. Moderately thickened, severely calcified leaflets. There was severe stenosis. Peak velocity (S): 411 cm/s. Mean gradient (S): 36 mm Hg. Valve area (VTI): 0.52 cm^2. Valve area (Vmax): 0.49 cm^2. Valve area (Vmean): 0.51 cm^2. - Mitral valve: Moderately to severely calcified annulus. There was moderate regurgitation. - Left atrium: The atrium was moderately to severely dilated. - Right ventricle: The cavity size was moderately dilated. Systolic function was moderately reduced. Lateral annular velocity 7.51 cm/s. - Right atrium: The atrium was massively dilated. - Atrial septum: The septum bowed from right to left, consistent with increased right atrial pressure. - Tricuspid valve: Mildly thickened leaflets. There was severe regurgitation. - Pulmonic valve: There was mild regurgitation. - Pulmonary arteries: PA peak pressure: 71 mm Hg (S). Severely elevated pulmonary pressures.   Antibiotics:    HPI/Subjective: Reports feeling better. Was given her nightly Xanax and was able to sleep without difficulty. Shortness of breath is improving. Denies n/v/d or chest pain .   Objective: Filed Vitals:   09/08/14 1505 09/08/14 2000 09/09/14 0500 09/09/14 0524  BP:   Pulse: 85 78 75   Temp: 98.5 F (36.9 C) 98.1 F (36.7 C) 97.8 F (36.6 C)   TempSrc: Oral Oral Oral   Resp: Height:      Weight:    73.7 kg (162 lb 7.7 oz)  SpO2: 100% 100% 99%     Intake/Output Summary (Last 24 hours) at 09/09/14 8295 Last data  filed at 09/09/14 0523  Gross per 24  hour  Intake    930 ml  Output   1200 ml  Net   -270 ml     Filed Weights   09/07/14 0608 09/08/14 0700 09/09/14 0524  Weight: 73.9 kg (162 lb 14.7 oz) 73.5 kg (162 lb 0.6 oz) 73.7 kg (162 lb 7.7 oz)    Exam:    Afebrile, VSS, not hypoxic General: Appears calm and comfortable Eyes: PERRL, normal lids, irises ENT: grossly normal hearing, lips & tongue Neck: no LAD, masses or thyromegaly Cardiovascular: RRR, no rubs or gallop 3/6 hollow systolic murmur. 3-4+ bilateral LE pitting edema. Respiratory: CTA bilaterally, no w/r/r. Normal respiratory effort. Abdomen: soft, nt, ventral hernia on the left side.  Musculoskeletal: kyphosis Psychiatric: grossly normal mood and affect, speech fluent and appropriate Neurologic: grossly non-focal. Foley catheter present.  New data reviewed:  1.2L urine output, lost 2 pounds since admission  BMP unremarkable  HGB 8.0, Platelet 91  Free T4 1.82  TSH slightly low 1.5, INR 2.69  Pertinent data since admission:    Pending data:    Scheduled Meds: . antiseptic oral rinse  7 mL Mouth Rinse BID  . calcium-vitamin D  1 tablet Oral TID  . chlorpheniramine-HYDROcodone  5 mL Oral Once  . diltiazem  30 mg Oral Q12H  . feeding supplement (ENSURE ENLIVE)  237 mL Oral BID BM  . furosemide  80 mg Intravenous Q12H  . levothyroxine  125 mcg Oral QAC breakfast  . potassium chloride  20 mEq Oral BID  . sodium chloride  3 mL Intravenous Q12H  . Warfarin - Pharmacist Dosing Inpatient   Does not apply Q24H   Continuous Infusions:   Principal Problem:   Acute respiratory failure Active Problems:   Acute exacerbation of CHF (congestive heart failure)   Atrial fibrillation with RVR   Hypothyroidism   Anemia   Essential hypertension   Chronic anticoagulation   Time spent 25 minutes   I, Jessica D. Jari Pigg, acting as scribe, recorded this note contemporaneously in the presence of Dr. Melton Alar. Irene Limbo, M.D. on 09/09/2014   I have  reviewed the above documentation for accuracy and completeness, and I agree with the above. Brendia Sacks, MD

## 2014-09-09 NOTE — Progress Notes (Signed)
BP 92/44 this am. Paged Dr Sharl Ma prior to administering 80 mg Lasix IV. Dr Sharl Ma ordered to give IV Lasix. Will continue to monitor pt. Bed remains in lowest position and call bell is within reach. Personal sitter in room with pt. Sitter notified nurse that she will be giving pt bath this am. Nurse will replace Intra-dry this am as well to groin area after groin area cleansed and dried gently.

## 2014-09-09 NOTE — Progress Notes (Signed)
Subjective:  Short of breath and tired just eating.  Objective:  Vital Signs in the last 24 hours: Temp:  [97.8 F (36.6 C)-98.5 F (36.9 C)] 97.8 F (36.6 C) (08/03 0500) Pulse Rate:  [71-85] 75 (08/03 0500) Resp:  [16-18] 16 (08/03 0500) BP: (92-96)/(39-44) 92/44 mmHg (08/03 0500) SpO2:  [99 %-100 %] 99 % (08/03 0500) Weight:  [162 lb 7.7 oz (73.7 kg)] 162 lb 7.7 oz (73.7 kg) (08/03 0524)  Intake/Output from previous day: 08/02 0701 - 08/03 0700 In: 930 [P.O.:930] Out: 1200 [Urine:1200] Intake/Output from this shift:    Physical Exam: NECK:Increased JVD, HJR, no bruit LUNGS: Decreased breath sounds with crackles at bases HEART: Regular rate and rhythm, 3/6 harsh systolic murmur, no gallop, rub, bruit, thrill, or heave EXTREMITIES: 3-4 plus edema   Lab Results:  Recent Labs  09/08/14 0638 09/09/14 0543  WBC 7.0 7.0  HGB 7.9* 8.0*  PLT 100* 91*    Recent Labs  09/08/14 0638 09/09/14 0543  NA 142 141  K 3.6 4.0  CL 93* 92*  CO2 43* 44*  GLUCOSE 91 92  BUN 30* 37*  CREATININE 0.67 0.78    Recent Labs  09/06/14 2143 09/07/14 0354  TROPONINI 0.03 0.03   Hepatic Function Panel  Recent Labs  09/06/14 1302  PROT 6.5  ALBUMIN 2.8*  AST 19  ALT 9*  ALKPHOS 77  BILITOT 1.2   No results for input(s): CHOL in the last 72 hours. No results for input(s): PROTIME in the last 72 hours.  Imaging:   Cardiac Studies:  Assessment/Plan:  Acute hypoxic respiratory failure and rapid atrial fibrillation secondary to diastolic and right heart failure I/o's negative 270 on IV Lasix  BID and metolazone. Needs better diuresis.  Severe aortic stenosis and severe  tricuspid regurgitation not a surgical candidate, palliative care consult   Chronic Atrial fibrillation with RVR: rate now controlled.  Severe pulmonary hypertension and consequent RV failure  Significant anemia probably secondary to GI bleed no longer Coumadin  candidate   Hypothyroidism    LOS: 3 days    Jacolyn Reedy 09/09/2014, 7:52 AM   The patient was seen and examined, and I agree with the assessment and plan as documented above, with modifications as noted below. Pt admitted with acute hypoxemic respiratory failure and rapid atrial fibrillation, found to be in acute combined decompensated diastolic and right heart failure. Has had minimal symptomatic improvement with IV diuresis (over 1.3 L output in lat 48+ hours). Also anemic and on chronic anticoagulation therapy with warfarin, Hgb stable at 8 today.  Echocardiogram showed severe aortic stenosis and severe tricuspid regurgitation. Severe aortic stenosis has led to high ventricular end-diastolic filling pressures with consequent moderate mitral regurgitation and severe pulmonary hypertension with consequent RV failure.  She remains hypotensive. Yesterday, I stopped both Coreg and benazepril and started short-acting diltiazem 30 mg bid for rate control for atrial fibrillation. Will continue IV Lasix and give another one-time dose of metolazone (this time higher dose of 5 mg) and reassess urinary output on 8/4.  Given her anemia, I feel she is a poor candidate for ongoing therapy with warfarin, albeit Hgb stable at 8 today. In spite of negative FOBT, she likely has a GI bleed.  She is a very poor candidate for surgical management and I suspect she will have repeated hospitalizations until she succumbs to her disease with eventual LV systolic dysfunction and failure. I again discussed this with the patient and family member present in room.  I have already consulted palliative care who will evaluate patient today.

## 2014-09-10 DIAGNOSIS — J9621 Acute and chronic respiratory failure with hypoxia: Secondary | ICD-10-CM

## 2014-09-10 DIAGNOSIS — I38 Endocarditis, valve unspecified: Secondary | ICD-10-CM

## 2014-09-10 DIAGNOSIS — I5031 Acute diastolic (congestive) heart failure: Secondary | ICD-10-CM

## 2014-09-10 DIAGNOSIS — I350 Nonrheumatic aortic (valve) stenosis: Secondary | ICD-10-CM

## 2014-09-10 DIAGNOSIS — D539 Nutritional anemia, unspecified: Secondary | ICD-10-CM

## 2014-09-10 DIAGNOSIS — D696 Thrombocytopenia, unspecified: Secondary | ICD-10-CM

## 2014-09-10 DIAGNOSIS — I509 Heart failure, unspecified: Secondary | ICD-10-CM

## 2014-09-10 DIAGNOSIS — R0602 Shortness of breath: Secondary | ICD-10-CM | POA: Insufficient documentation

## 2014-09-10 LAB — BASIC METABOLIC PANEL
Anion gap: 5 (ref 5–15)
BUN: 41 mg/dL — ABNORMAL HIGH (ref 6–20)
CHLORIDE: 89 mmol/L — AB (ref 101–111)
CO2: 47 mmol/L — AB (ref 22–32)
Calcium: 10.2 mg/dL (ref 8.9–10.3)
Creatinine, Ser: 0.72 mg/dL (ref 0.44–1.00)
GFR calc Af Amer: >60 mL/min (ref >60)
GFR calc non Af Amer: >60 mL/min (ref >60)
Glucose, Bld: 92 mg/dL (ref 65–99)
Potassium: 4.2 mmol/L (ref 3.5–5.1)
SODIUM: 141 mmol/L (ref 135–145)

## 2014-09-10 LAB — TYPE AND SCREEN
ABO/RH(D): A NEG
Antibody Screen: NEGATIVE
UNIT DIVISION: 0
UNIT DIVISION: 0
Unit division: 0

## 2014-09-10 MED ORDER — BENZONATATE 100 MG PO CAPS
100.0000 mg | ORAL_CAPSULE | Freq: Two times a day (BID) | ORAL | Status: DC | PRN
  Administered 2014-09-10: 100 mg via ORAL
  Filled 2014-09-10: qty 1

## 2014-09-10 MED ORDER — GUAIFENESIN ER 600 MG PO TB12
600.0000 mg | ORAL_TABLET | Freq: Two times a day (BID) | ORAL | Status: DC
  Filled 2014-09-10: qty 1

## 2014-09-10 NOTE — Progress Notes (Signed)
Daily Progress Note   Patient Name: Lynn Jordan       Date: 09/10/2014 DOB: 06-10-1925  Age: 81 y.o. MRN#: [id number] Attending Physician: Standley Brooking, MD Primary Care Physician: Caryl Never Admit Date: 09/06/2014  Reason for Consultation/Follow-up: Establishing goals of care and Psychosocial/spiritual support  Subjective: Mrs. Fullard is sitting up in bed today, able to make and keep eye contact with me.  She states she isnt doing well because of her breathing, stating she wants to lie down, but can't d/t breathing and her back.  We talk about her HF, and my discussion with cardiology, Dr. Purvis Sheffield, this morning.  I let her know that it wasn't good news, and ask if she wants to hear.  She does and we talk about how her heart is very weak, and her valve is not opening like it should.  She states she is worried about her care at home and how she will die.  Mrs. Swett's daughter Ms. Val Eagle is in the room and cries out, "Mama, you're not going to die."  The lunch tray comes in and we sit quietly during delivery.   I ask Mrs. Babson to repeat what she said, and we talk about her heart problem likely causing her death.  Mrs. Harten asks what this will be like, and we talk about trouble breathing, and chest pain and the use of morphine to relieve these symptoms.  She states she doesn't want this d/t nausea. We talk about the nausea improving after ~24 hours of use.  Ms. Val Eagle states, Mama the people we talked about coming will help you, and I state that Hospice will be a big help to her. Ms. Val Eagle shakes her head "No", and states palliative.  Mrs. Brockwell states she has no more questions, and I go to talk with Ms. Val Eagle, but she requests we talk more later, telling nursing staff that she can't talk anymore today.   I will continue to support Mrs. Harb and Ms. Lemons.     Length of Stay: 4 days  Current Medications: Scheduled Meds:  . ALPRAZolam  0.25 mg Oral QHS  .  antiseptic oral rinse  7 mL Mouth Rinse BID  . calcium-vitamin D  1 tablet Oral TID  . diltiazem  30 mg Oral Q12H  . feeding supplement (ENSURE ENLIVE)  237 mL Oral BID BM  . furosemide  80 mg Intravenous Q12H  . levothyroxine  125 mcg Oral QAC breakfast  . potassium chloride  20 mEq Oral BID  . sodium chloride  3 mL Intravenous Q12H    Continuous Infusions:    PRN Meds: sodium chloride, acetaminophen, ALPRAZolam, benzonatate, ondansetron (ZOFRAN) IV, sodium chloride  Palliative Performance Scale: 30%%     Vital Signs: BP 98/40 mmHg  Pulse 92  Temp(Src) 98.2 F (36.8 C) (Oral)  Resp 16  Ht 5\' 4"  (1.626 m)  Wt 73.9 kg (162 lb 14.7 oz)  BMI 27.95 kg/m2  SpO2 100% SpO2: SpO2: 100 % O2 Device: O2 Device: Nasal Cannula O2 Flow Rate: O2 Flow Rate (L/min): 3 L/min  Intake/output summary:  Intake/Output Summary (Last 24 hours) at 09/10/14 1607 Last data filed at 09/10/14 1348  Gross per 24 hour  Intake    360 ml  Output   1400 ml  Net  -1040 ml   LBM:   Baseline Weight: Weight: 74.4 kg (164 lb 0.4 oz) Most recent weight: Weight: 73.9 kg (162 lb 14.7 oz)  Physical Exam:  Constitutional: Frail elderly, lying in bed, makes and keeps eye contact.  Resp: even, some work of breathing today.  GI: Soft non tender Extremities: 3+ pitting edema to bl lower legs,  Musc: Generalized weakness, bl leg strength 1/5; Marked kyphosis.               Additional Data Reviewed: Recent Labs     09/08/14  0638  09/09/14  0543  09/10/14  0558  WBC  7.0  7.0   --   HGB  7.9*  8.0*   --   PLT  100*  91*   --   NA  142  141  141  BUN  30*  37*  41*  CREATININE  0.67  0.78  0.72     Problem List:  Patient Active Problem List   Diagnosis Date Noted  . Acute on chronic respiratory failure with hypoxia 09/10/2014  . Macrocytic anemia 09/10/2014  . Acute diastolic CHF (congestive heart failure) 09/10/2014  . Thrombocytopenia 09/10/2014  . Severe aortic stenosis 09/10/2014  .  Palliative care encounter   . DNR (do not resuscitate) discussion   . Essential hypertension 09/08/2014  . Chronic anticoagulation 09/08/2014  . Acute exacerbation of CHF (congestive heart failure) 09/06/2014  . Atrial fibrillation with RVR 09/06/2014  . Hypothyroidism 09/06/2014     Palliative Care Assessment & Plan    Code Status:  DNR  Goals of Care:  Home with HH or Hospice, undecided.   Symptom Management:  We discussed the use of Morphine for pain and dyspnea today, but patient is unsure d/t side effects.   Psycho-social/Spiritual:  Desire for further Chaplaincy support: Not discussed today.    Prognosis: < 6 months Discharge Planning: Home with Home Health or Hospice.    Care plan was discussed with Nursing staff, CM, SW, and Dr. Purvis Sheffield, will share with Dr. Irene Limbo on next rounds.    Thank you for allowing the Palliative Medicine Team to assist in the care of this patient.   Time In: 1130 Time Out: 1200 Total Time 30 minutes Prolonged Time Billed  no     Greater than 50%  of this time was spent counseling and coordinating care related to the above assessment and plan.   Katheran Awe, NP  09/10/2014, 4:07 PM  Please contact Palliative Medicine Team phone at [phone] for questions and concerns.

## 2014-09-10 NOTE — Progress Notes (Signed)
SUBJECTIVE: Feels the same as yesterday with regards to breathing.     Intake/Output Summary (Last 24 hours) at 09/10/14 1026 Last data filed at 09/10/14 0947  Gross per 24 hour  Intake    360 ml  Output   1050 ml  Net   -690 ml    Current Facility-Administered Medications  Medication Dose Route Frequency Provider Last Rate Last Dose  . 0.9 %  sodium chloride infusion  250 mL Intravenous PRN Henderson Cloud, MD 10 mL/hr at 09/07/14 0800 250 mL at 09/07/14 0800  . acetaminophen (TYLENOL) tablet 650 mg  650 mg Oral Q4H PRN Henderson Cloud, MD   650 mg at 09/09/14 0939  . ALPRAZolam Prudy Feeler) tablet 0.25 mg  0.25 mg Oral QHS Standley Brooking, MD   0.25 mg at 09/09/14 2138  . ALPRAZolam Prudy Feeler) tablet 0.25 mg  0.25 mg Oral Daily PRN Standley Brooking, MD      . antiseptic oral rinse (CPC / CETYLPYRIDINIUM CHLORIDE 0.05%) solution 7 mL  7 mL Mouth Rinse BID Henderson Cloud, MD   7 mL at 09/10/14 0940  . calcium-vitamin D (OSCAL WITH D) 500-200 MG-UNIT per tablet 1 tablet  1 tablet Oral TID Henderson Cloud, MD   1 tablet at 09/10/14 0940  . diltiazem (CARDIZEM) tablet 30 mg  30 mg Oral Q12H Laqueta Linden, MD   30 mg at 09/08/14 1726  . feeding supplement (ENSURE ENLIVE) (ENSURE ENLIVE) liquid 237 mL  237 mL Oral BID BM Henderson Cloud, MD   237 mL at 09/10/14 0949  . furosemide (LASIX) injection 80 mg  80 mg Intravenous Q12H Henderson Cloud, MD   80 mg at 09/10/14 0251  . levothyroxine (SYNTHROID, LEVOTHROID) tablet 125 mcg  125 mcg Oral QAC breakfast Henderson Cloud, MD   125 mcg at 09/10/14 0940  . ondansetron (ZOFRAN) injection 4 mg  4 mg Intravenous Q6H PRN Henderson Cloud, MD      . potassium chloride 20 MEQ/15ML (10%) solution 20 mEq  20 mEq Oral BID Roma Kayser Schorr, NP   20 mEq at 09/10/14 0940  . sodium chloride 0.9 % injection 3 mL  3 mL Intravenous Q12H Henderson Cloud, MD   3  mL at 09/10/14 0940  . sodium chloride 0.9 % injection 3 mL  3 mL Intravenous PRN Henderson Cloud, MD   3 mL at 09/10/14 0941    Filed Vitals:   09/09/14 0524 09/09/14 1425 09/09/14 2101 09/10/14 0646  BP:  102/50 109/35 95/42  Pulse:  77 84 83  Temp:  97.6 F (36.4 C) 98.3 F (36.8 C) 98.1 F (36.7 C)  TempSrc:  Oral Oral Oral  Resp:  16 16 16   Height:      Weight: 162 lb 7.7 oz (73.7 kg)   162 lb 14.7 oz (73.9 kg)  SpO2:  100% 100% 100%    PHYSICAL EXAM General: Kyphotic HEENT: Normal. Neck: Difficult to assess JVP due to kyphosis.  Lungs: Diminished sounds with bibasilar rales (faint). CV: Regular rate and rhythm, normal S1/S2, no S3/S4, 4/6 harsh pansystolic murmur heard throughout precordium and posterior lung fields.  2+ pitting pretibial and dorsal pedal edema.  Abdomen: No distention.  Neurologic: Alert and oriented.  Psych: Flat affect. Musculoskeletal: Kyphotic   LABS: Basic Metabolic Panel:  Recent Labs  16/10/96 0543 09/10/14 0558  NA  141 141  K 4.0 4.2  CL 92* 89*  CO2 44* 47*  GLUCOSE 92 92  BUN 37* 41*  CREATININE 0.78 0.72  CALCIUM 9.9 10.2   Liver Function Tests: No results for input(s): AST, ALT, ALKPHOS, BILITOT, PROT, ALBUMIN in the last 72 hours. No results for input(s): LIPASE, AMYLASE in the last 72 hours. CBC:  Recent Labs  09/08/14 0638 09/09/14 0543  WBC 7.0 7.0  HGB 7.9* 8.0*  HCT 27.1* 27.8*  MCV 100.4* 103.3*  PLT 100* 91*   Cardiac Enzymes: No results for input(s): CKTOTAL, CKMB, CKMBINDEX, TROPONINI in the last 72 hours. BNP: Invalid input(s): POCBNP D-Dimer: No results for input(s): DDIMER in the last 72 hours. Hemoglobin A1C: No results for input(s): HGBA1C in the last 72 hours. Fasting Lipid Panel: No results for input(s): CHOL, HDL, LDLCALC, TRIG, CHOLHDL, LDLDIRECT in the last 72 hours. Thyroid Function Tests:  Recent Labs  09/09/14 0553  TSH 0.154*   Anemia Panel: No results for input(s):  VITAMINB12, FOLATE, FERRITIN, TIBC, IRON, RETICCTPCT in the last 72 hours.  RADIOLOGY: Dg Chest Port 1 View  09/06/2014   CLINICAL DATA:  Shortness of breath  EXAM: PORTABLE CHEST - 1 VIEW  COMPARISON:  06/21/2012  FINDINGS: Grossly unchanged enlarged cardiac silhouette and mediastinal contours given accentuated kyphotic projection. Pulmonary vasculature appears less distinct than present examination with cephalization of flow. Interval development small bilateral effusions with associated worsening heterogeneous/consolidative opacities, left greater than right. No definite pneumothorax. Grossly unchanged bones.  IMPRESSION: Suspected development of pulmonary edema, bilateral effusions and associated bibasilar atelectasis on this portable kyphotic examination.   Electronically Signed   By: Simonne Come M.D.   On: 09/06/2014 13:07      ASSESSMENT AND PLAN: Pt admitted with acute hypoxemic respiratory failure and rapid atrial fibrillation, found to be in acute combined decompensated diastolic and right heart failure. Has had minimal symptomatic improvement with IV diuresis (1.3 L output in last 24 hours). Also anemic, warfarin has been discontinued.  Echocardiogram showed severe aortic stenosis and severe tricuspid regurgitation. Severe aortic stenosis has led to high ventricular end-diastolic filling pressures with consequent moderate mitral regurgitation and severe pulmonary hypertension with consequent RV failure.  She remains hypotensive. I previously stopped both Coreg and benazepril and started short-acting diltiazem 30 mg bid for rate control for atrial fibrillation. Will continue IV Lasix and give another one-time dose of metolazone (5 mg) and reassess urinary output on 8/5. BUN increasing indicative of cardiorenal syndrome.  She is a very poor candidate for surgical management and I suspect she will have repeated hospitalizations until she succumbs to her disease with eventual LV systolic  dysfunction and failure. I previously discussed this with the patient and daughter, and discussed this with Lillia Carmel of Palliative Care.   Prentice Docker, M.D., F.A.C.C.

## 2014-09-10 NOTE — Care Management Important Message (Signed)
Important Message  Patient Details  Name: TELA KOTECKI MRN: [id number] Date of Birth: [birth year]   Medicare Important Message Given:  Yes-second notification given    Malcolm Metro, RN 09/10/2014, 9:39 AM

## 2014-09-10 NOTE — Progress Notes (Addendum)
PROGRESS NOTE  Lynn Jordan VHQ:[id number] DOB: 11/23/1925 DOA: 09/06/2014 PCP: Remus Loffler, PA-C  Summary: 81 yo female with history of atrial fibrillation, hypothyroidism and CHF of unknown type presented with complaints of shortness of breath and lower extremity edema.  Assessment/Plan: 1. Acute on chronic hypoxic respiratory failure. Stable. Continue to try to wean down oxygen supplement to 2L. 2. A fib with RVR, remains rate-controlled. Continue diltiazem. 3. Macrocytic anemia. Stable s/p 2 units PRBC. No workup planned, warfarin discontinued. 4. Acute diastolic heart failure, severe aortic stenosis, severe TR, severe PHTN, with consequent RV failure with significant bilateral LE edema. 5. Thrombocytopenia, stable. Etiology unclear, present on admission. Given family desire, no further evaluation planned. 6. Hypothyroidism. TSH slightly low. T4 slightly high. Recommend recheck as outpatient.    Overall a little worse in regards to breathing. Lower extremity edema improving, renal function stable..       Agree with plans for further diuresis.   Discussed guarded prognosis with daughter at bedside. Broached hospice, daughter understands prognosis is poor and patient likely to worsen in the near future.  Possibly home next 48 hours depending on final GOC +/- hospice.   Code Status: DNR DVT prophylaxis: Warfarin Family Communication: Daughter (POA) bedside. Disposition Plan: Anticipate discharge in 48 hours  Brendia Sacks, MD  [facility]  Pager [phone] If 7PM-7AM, please contact night-coverage at [email or web address], password [facility] 09/10/2014, 6:47 AM  LOS: 4 days   Consultants:  Cardiology  Palliative Care  Procedures:  Transfusion 2 units PRBC  Echo reveals: - Left ventricle: The cavity size was normal. There was mild concentric hypertrophy. Systolic function was normal. The estimated ejection fraction was in the range of 60% to 65%. The study was  not technically sufficient to allow evaluation of LV diastolic dysfunction due to atrial fibrillation. However, in context of severe aortic stenosis with biatrial enlargement and pulmonary hypertension, findings strongly suggestive of diastolic dysfunction. Doppler parameters are consistent with both elevated ventricular end-diastolic filling pressure and elevated left atrial filling pressure. Ratio of mitral valve peak E velocity to medial annulus E (e&') velocity: 20.61. - Aortic valve: Moderately to severely calcified annulus. Moderately thickened, severely calcified leaflets. There was severe stenosis. Peak velocity (S): 411 cm/s. Mean gradient (S): 36 mm Hg. Valve area (VTI): 0.52 cm^2. Valve area (Vmax): 0.49 cm^2. Valve area (Vmean): 0.51 cm^2. - Mitral valve: Moderately to severely calcified annulus. There was moderate regurgitation. - Left atrium: The atrium was moderately to severely dilated. - Right ventricle: The cavity size was moderately dilated. Systolic function was moderately reduced. Lateral annular velocity 7.51 cm/s. - Right atrium: The atrium was massively dilated. - Atrial septum: The septum bowed from right to left, consistent with increased right atrial pressure. - Tricuspid valve: Mildly thickened leaflets. There was severe regurgitation. - Pulmonic valve: There was mild regurgitation. - Pulmonary arteries: PA peak pressure: 71 mm Hg (S). Severely elevated pulmonary pressures.   Antibiotics:    HPI/Subjective: Short of breath when she tries to talk but no pain. Able to sleep and eat without difficulty. No reports of chest pain, n/v/d, or abd pain.  Objective: Filed Vitals:   09/09/14 0524 09/09/14 1425 09/09/14 2101 09/10/14 0646  BP:  102/50 109/35 95/42  Pulse:  77 84 83  Temp:  97.6 F (36.4 C) 98.3 F (36.8 C) 98.1 F (36.7 C)  TempSrc:  Oral Oral Oral  Resp:  <BADTSYDNE KRAHL:      Weight: 73.7 kg (162 lb  7.7 oz)     SpO2:  100% 100% 100%    Intake/Output Summary (Last 24 hours) at 09/10/14 0647 Last data filed at 09/09/14 2300  Gross per 24 hour  Intake    240 ml  Output   1525 ml  Net  -1285 ml     Filed Weights   09/07/14 0608 09/08/14 0700 09/09/14 0524  Weight: 73.9 kg (162 lb 14.7 oz) 73.5 kg (162 lb 0.6 oz) 73.7 kg (162 lb 7.7 oz)    Exam:     Afebrile, VSS, not hypoxic Blood pressure in the 90's General:  Appears comfortable, calm. Eating lunch. Cardiovascular No rub or gallop.  2/6 hollow systolic murmur RSUB 3+ BLE edema but better then yesterday. Telemetry: Afib, rated controlled.  Respiratory: Clear to auscultation bilaterally, no wheezes, rales or rhonchi. Slightly out of breath when speaking but able to speak in full sentences. Abdomen: soft, ntnd Musculoskeletal: grossly normal tone bilateral upper and lower extremities Psychiatric: grossly normal mood and affect, speech fluent and appropriate Neurologic: grossly non-focal.  New data reviewed:  1.5L urine output  Bowels moving  Creatinine stable  BMP stable  Pertinent data since admission:    Pending data:    Scheduled Meds: . ALPRAZolam  0.25 mg Oral QHS  . antiseptic oral rinse  7 mL Mouth Rinse BID  . calcium-vitamin D  1 tablet Oral TID  . diltiazem  30 mg Oral Q12H  . feeding supplement (ENSURE ENLIVE)  237 mL Oral BID BM  . furosemide  80 mg Intravenous Q12H  . levothyroxine  125 mcg Oral QAC breakfast  . potassium chloride  20 mEq Oral BID  . sodium chloride  3 mL Intravenous Q12H   Continuous Infusions:   Principal Problem:   Acute on chronic respiratory failure with hypoxia Active Problems:   Acute exacerbation of CHF (congestive heart failure)   Atrial fibrillation with RVR   Hypothyroidism   Essential hypertension   Chronic anticoagulation   Palliative care encounter   DNR (do not resuscitate) discussion   Macrocytic anemia   Acute diastolic CHF (congestive heart  failure)   Thrombocytopenia   Severe aortic stenosis   Time spent 25 minutes   I, Jessica D. Jari Pigg, acting as scribe, recorded this note contemporaneously in the presence of Dr. Melton Alar. Irene Limbo, M.D. on 09/10/2014   I have reviewed the above documentation for accuracy and completeness, and I agree with the above. 09/10/14. Brendia Sacks, MD

## 2014-09-11 DIAGNOSIS — J9621 Acute and chronic respiratory failure with hypoxia: Secondary | ICD-10-CM

## 2014-09-11 DIAGNOSIS — Z7189 Other specified counseling: Secondary | ICD-10-CM

## 2014-09-11 LAB — BASIC METABOLIC PANEL
Anion gap: 5 (ref 5–15)
BUN: 46 mg/dL — ABNORMAL HIGH (ref 6–20)
CALCIUM: 10.3 mg/dL (ref 8.9–10.3)
CO2: 50 mmol/L — ABNORMAL HIGH (ref 22–32)
CREATININE: 0.81 mg/dL (ref 0.44–1.00)
Chloride: 87 mmol/L — ABNORMAL LOW (ref 101–111)
GFR calc non Af Amer: >60 mL/min (ref >60)
Glucose, Bld: 89 mg/dL (ref 65–99)
POTASSIUM: 4.2 mmol/L (ref 3.5–5.1)
Sodium: 142 mmol/L (ref 135–145)

## 2014-09-11 MED ORDER — ALPRAZOLAM 0.25 MG PO TABS: 0.2500 mg | ORAL_TABLET | Freq: Two times a day (BID) | ORAL | Status: DC | PRN

## 2014-09-11 MED ORDER — TORSEMIDE 20 MG PO TABS
20.0000 mg | ORAL_TABLET | Freq: Two times a day (BID) | ORAL | Status: DC
  Administered 2014-09-11 – 2014-09-12 (×2): 20 mg via ORAL
  Filled 2014-09-11 (×2): qty 1

## 2014-09-11 NOTE — Care Management Important Message (Signed)
Important Message  Patient Details  Name: Lynn Jordan MRN: [id number] Date of Birth: Jul 05, 1925   Medicare Important Message Given:  Yes-third notification given    Malcolm Metro, RN 09/11/2014, 2:34 PM

## 2014-09-11 NOTE — Progress Notes (Signed)
Brief visit. Patient was sleeping so briefly talked with her daughter who voiced hopes that her mother would be getting much better. She shared she was wondering why her mother wanted to sleep so much and hoped again that she would be getting stronger. There seemed to be doubts about her mother's actual improving health but she framed those around her message about hope.

## 2014-09-11 NOTE — Progress Notes (Addendum)
PROGRESS NOTE  Lynn Jordan ZOX:[id number] DOB: 1925/09/07 DOA: 09/06/2014 PCP: Remus Loffler, PA-C  Summary: 81 yo female with history of atrial fibrillation, hypothyroidism and CHF of unknown type presented with complaints of shortness of breath and lower extremity edema.  Assessment/Plan: 1. Acute diastolic heart failure, severe aortic stenosis, severe TR, severe PHTN, with consequent RV failure with significant bilateral LE edema. Edema improved, creatinine stable, but appears worse. 2. Acute on chronic hypoxic respiratory failure. Acute component appears resolved. Continue on 2L [place].   3. A fib with RVR, remains stable. Continue diltiazem. 4. Macrocytic anemia. Stable s/p 2 units PRBC. No workup planned, warfarin discontinued. Discussed again with daughter at bedside today, no workup desired per family. 5. Thrombocytopenia, stable. Etiology unclear, present on admission. Given family desire, no further evaluation planned. 6. Hypothyroidism. TSH slightly low. T4 slightly high. Recommend recheck as outpatient.    Excellent diueresis but more symptomatic with SOB. Prognosis is poor, she is worsening despite maximal medical therapy and is not a surgical candidate.  We discussed cardiology recommendations and poor prognosis. Patient and daughter understand. Open to considering hospice. Prognosis likely days to weeks.  Agree with oral diuretics    Tylenol for pain as needed and xanax PRN  Anticipate discharge tomorrow, hopefully with hospice   Code Status: DNR DVT prophylaxis: SCDs Family Communication: Daughter (POA) bedside, no questions at this time Disposition Plan: Anticipate discharge tomorrow   Brendia Sacks, MD  [facility]  Pager [phone] If 7PM-7AM, please contact night-coverage at [email or web address], password [email or web address] 09/11/2014, 7:25 AM  LOS: 5 days   Consultants:  Cardiology  Palliative Care  Procedures:  Transfusion 2 units PRBC  Echo reveals: - Left  ventricle: The cavity size was normal. There was mild concentric hypertrophy. Systolic function was normal. The estimated ejection fraction was in the range of 60% to 65%. The study was not technically sufficient to allow evaluation of LV diastolic dysfunction due to atrial fibrillation. However, in context of severe aortic stenosis with biatrial enlargement and pulmonary hypertension, findings strongly suggestive of diastolic dysfunction. Doppler parameters are consistent with both elevated ventricular end-diastolic filling pressure and elevated left atrial filling pressure. Ratio of mitral valve peak E velocity to medial annulus E (e&') velocity: 20.61. - Aortic valve: Moderately to severely calcified annulus. Moderately thickened, severely calcified leaflets. There was severe stenosis. Peak velocity (S): 411 cm/s. Mean gradient (S): 36 mm Hg. Valve area (VTI): 0.52 cm^2. Valve area (Vmax): 0.49 cm^2. Valve area (Vmean): 0.51 cm^2. - Mitral valve: Moderately to severely calcified annulus. There was moderate regurgitation. - Left atrium: The atrium was moderately to severely dilated. - Right ventricle: The cavity size was moderately dilated. Systolic function was moderately reduced. Lateral annular velocity 7.51 cm/s. - Right atrium: The atrium was massively dilated. - Atrial septum: The septum bowed from right to left, consistent with increased right atrial pressure. - Tricuspid valve: Mildly thickened leaflets. There was severe regurgitation. - Pulmonic valve: There was mild regurgitation. - Pulmonary arteries: PA peak pressure: 71 mm Hg (S). Severely elevated pulmonary pressures.   Antibiotics:    HPI/Subjective: Reports worsening SOB, difficulty sleeping at night. Able to eat without difficulty. No n/v. Reports pain on back side of legs  Objective: Filed Vitals:   09/10/14 0646 09/10/14 1346 09/10/14 2152 09/11/14 0546  BP: 95/42  98/40 119/38 104/46  Pulse: 83 92 64 57  Temp: 98.1 F (36.7 C) 98.2 F (36.8 C) 97.5 F (36.4 C) 97.7 F (36.5 C)  TempSrc:  Oral Oral Oral Oral  Resp: 16 16 16 16   Height:      Weight: 73.9 kg (162 lb 14.7 oz)   74.9 kg (165 lb 2 oz)  SpO2: 100% 100% 100% 100%    Intake/Output Summary (Last 24 hours) at 09/11/14 0725 Last data filed at 09/11/14 0553  Gross per 24 hour  Intake    120 ml  Output   1450 ml  Net  -1330 ml     Filed Weights   09/09/14 0524 09/10/14 0646 09/11/14 0546  Weight: 73.7 kg (162 lb 7.7 oz) 73.9 kg (162 lb 14.7 oz) 74.9 kg (165 lb 2 oz)    Exam: Afebrile, not hypoxic Blood pressure in the 100's General:  Appears calm and mildly uncomfortable, appears SOB, appears ill, worse today ENT: grossly normal hearing, lips & tongue Cardiovascular: 3/6 holosystolic murmur, RUSB. no rub or gallop. 3+ BLE edema improved markedly, skin wrinkling noted.  Respiratory: CTA on right, crackles on left, no w/r. Moderate respiratory effort. Able to speak in fulls sentences   Psychiatric: grossly normal mood and affect, speech fluent and appropriate Neurologic: grossly non-focal.  New data reviewed:  BM x1  BMP notable for BUN 46, increased, creatinine stable .81  UOP 1415  Pertinent data since admission:    Pending data:    Scheduled Meds: . ALPRAZolam  0.25 mg Oral QHS  . antiseptic oral rinse  7 mL Mouth Rinse BID  . calcium-vitamin D  1 tablet Oral TID  . diltiazem  30 mg Oral Q12H  . feeding supplement (ENSURE ENLIVE)  237 mL Oral BID BM  . furosemide  80 mg Intravenous Q12H  . levothyroxine  125 mcg Oral QAC breakfast  . potassium chloride  20 mEq Oral BID  . sodium chloride  3 mL Intravenous Q12H   Continuous Infusions:   Principal Problem:   Acute on chronic respiratory failure with hypoxia Active Problems:   Acute exacerbation of CHF (congestive heart failure)   Atrial fibrillation with RVR   Hypothyroidism   Essential hypertension    Chronic anticoagulation   Palliative care encounter   DNR (do not resuscitate) discussion   Macrocytic anemia   Acute diastolic CHF (congestive heart failure)   Thrombocytopenia   Severe aortic stenosis   Shortness of breath   Time spent 40 minutes   I, Arielle Khosrowpour, acting a scribe, recorded this note contemporaneously in the presence of Dr. Melton Alar. Irene Limbo, M.D. on 09/11/2014  I have reviewed the above documentation for accuracy and completeness, and I agree with the above.   Brendia Sacks, MD

## 2014-09-11 NOTE — Progress Notes (Signed)
SUBJECTIVE: Feels the same as yesterday with regards to breathing. Feels tired. Patient spoke with Lillia Carmel from palliative care yesterday and patient seems more understanding than daughter of her condition.     Intake/Output Summary (Last 24 hours) at 09/11/14 1024 Last data filed at 09/11/14 1610  Gross per 24 hour  Intake    480 ml  Output   1450 ml  Net   -970 ml    Current Facility-Administered Medications  Medication Dose Route Frequency Provider Last Rate Last Dose  . 0.9 %  sodium chloride infusion  250 mL Intravenous PRN Henderson Cloud, MD 10 mL/hr at 09/07/14 0800 250 mL at 09/07/14 0800  . acetaminophen (TYLENOL) tablet 650 mg  650 mg Oral Q4H PRN Henderson Cloud, MD   650 mg at 09/11/14 0125  . ALPRAZolam Prudy Feeler) tablet 0.25 mg  0.25 mg Oral QHS Standley Brooking, MD   0.25 mg at 09/10/14 2140  . ALPRAZolam Prudy Feeler) tablet 0.25 mg  0.25 mg Oral Daily PRN Standley Brooking, MD      . antiseptic oral rinse (CPC / CETYLPYRIDINIUM CHLORIDE 0.05%) solution 7 mL  7 mL Mouth Rinse BID Henderson Cloud, MD   7 mL at 09/10/14 2140  . benzonatate (TESSALON) capsule 100 mg  100 mg Oral BID PRN Standley Brooking, MD   100 mg at 09/10/14 1606  . calcium-vitamin D (OSCAL WITH D) 500-200 MG-UNIT per tablet 1 tablet  1 tablet Oral TID Henderson Cloud, MD   1 tablet at 09/10/14 2140  . diltiazem (CARDIZEM) tablet 30 mg  30 mg Oral Q12H Laqueta Linden, MD   30 mg at 09/08/14 1726  . feeding supplement (ENSURE ENLIVE) (ENSURE ENLIVE) liquid 237 mL  237 mL Oral BID BM Henderson Cloud, MD   237 mL at 09/10/14 0949  . furosemide (LASIX) injection 80 mg  80 mg Intravenous Q12H Henderson Cloud, MD   80 mg at 09/11/14 0241  . levothyroxine (SYNTHROID, LEVOTHROID) tablet 125 mcg  125 mcg Oral QAC breakfast Henderson Cloud, MD   125 mcg at 09/10/14 0940  . ondansetron (ZOFRAN) injection 4 mg  4 mg Intravenous Q6H PRN  Henderson Cloud, MD      . potassium chloride 20 MEQ/15ML (10%) solution 20 mEq  20 mEq Oral BID Leanne Chang, NP   20 mEq at 09/10/14 2140  . sodium chloride 0.9 % injection 3 mL  3 mL Intravenous Q12H Henderson Cloud, MD   3 mL at 09/10/14 2140  . sodium chloride 0.9 % injection 3 mL  3 mL Intravenous PRN Henderson Cloud, MD   3 mL at 09/10/14 1528    Filed Vitals:   09/10/14 0646 09/10/14 1346 09/10/14 2152 09/11/14 0546  BP: 95/42 98/40 119/38 104/46  Pulse: 83 92 64 57  Temp: 98.1 F (36.7 C) 98.2 F (36.8 C) 97.5 F (36.4 C) 97.7 F (36.5 C)  TempSrc: Oral Oral Oral Oral  Resp: 16 16 16 16   Height:      Weight: 162 lb 14.7 oz (73.9 kg)   165 lb 2 oz (74.9 kg)  SpO2: 100% 100% 100% 100%    PHYSICAL EXAM General: Kyphotic HEENT: Normal. Neck: Difficult to assess JVP due to kyphosis.  Lungs: Diminished sounds with bibasilar rales (faint). CV: Regular rate and rhythm, normal S1/S2, no S3/S4, 4/6 harsh  pansystolic murmur heard throughout precordium and posterior lung fields. 2+ pitting pretibial and dorsal pedal edema. Abdomen: No distention.  Neurologic: Alert and oriented.  Psych: Flat affect. Musculoskeletal: Kyphotic  LABS: Basic Metabolic Panel:  Recent Labs  16/10/96 0558 09/11/14 0600  NA 141 142  K 4.2 4.2  CL 89* 87*  CO2 47* 50*  GLUCOSE 92 89  BUN 41* 46*  CREATININE 0.72 0.81  CALCIUM 10.2 10.3   Liver Function Tests: No results for input(s): AST, ALT, ALKPHOS, BILITOT, PROT, ALBUMIN in the last 72 hours. No results for input(s): LIPASE, AMYLASE in the last 72 hours. CBC:  Recent Labs  09/09/14 0543  WBC 7.0  HGB 8.0*  HCT 27.8*  MCV 103.3*  PLT 91*   Cardiac Enzymes: No results for input(s): CKTOTAL, CKMB, CKMBINDEX, TROPONINI in the last 72 hours. BNP: Invalid input(s): POCBNP D-Dimer: No results for input(s): DDIMER in the last 72 hours. Hemoglobin A1C: No results for input(s): HGBA1C in the  last 72 hours. Fasting Lipid Panel: No results for input(s): CHOL, HDL, LDLCALC, TRIG, CHOLHDL, LDLDIRECT in the last 72 hours. Thyroid Function Tests:  Recent Labs  09/09/14 0553  TSH 0.154*   Anemia Panel: No results for input(s): VITAMINB12, FOLATE, FERRITIN, TIBC, IRON, RETICCTPCT in the last 72 hours.  RADIOLOGY: Dg Chest Port 1 View  09/06/2014   CLINICAL DATA:  Shortness of breath  EXAM: PORTABLE CHEST - 1 VIEW  COMPARISON:  06/21/2012  FINDINGS: Grossly unchanged enlarged cardiac silhouette and mediastinal contours given accentuated kyphotic projection. Pulmonary vasculature appears less distinct than present examination with cephalization of flow. Interval development small bilateral effusions with associated worsening heterogeneous/consolidative opacities, left greater than right. No definite pneumothorax. Grossly unchanged bones.  IMPRESSION: Suspected development of pulmonary edema, bilateral effusions and associated bibasilar atelectasis on this portable kyphotic examination.   Electronically Signed   By: Simonne Come M.D.   On: 09/06/2014 13:07      ASSESSMENT AND PLAN: Pt admitted with acute hypoxemic respiratory failure and rapid atrial fibrillation, found to be in acute combined decompensated diastolic and right heart failure. Has had minimal symptomatic improvement with IV diuresis (1.33 L output in last 24 hours). Also anemic, warfarin has been discontinued.  Echocardiogram showed severe aortic stenosis and severe tricuspid regurgitation. Severe aortic stenosis has led to high ventricular end-diastolic filling pressures with consequent moderate mitral regurgitation and severe pulmonary hypertension with consequent RV failure.  BP stable today. I previously stopped both Coreg and benazepril and started short-acting diltiazem 30 mg bid for rate control for atrial fibrillation. I will switch to torsemide 20 mg bid. She may also need 2.5 mg metolazone three days per week at  time of discharge. BUN increasing indicative of cardiorenal syndrome.  She is a very poor candidate for surgical management and I suspect she will have repeated hospitalizations until she succumbs to her disease with eventual LV systolic dysfunction and failure. I previously discussed this with the patient and daughter, and discussed this again today with Lillia Carmel of Palliative Care. It appears while patient is understanding, daughter seems less so.  No further recommendations at this time.  Prentice Docker, M.D., F.A.C.C.

## 2014-09-11 NOTE — Care Management Note (Signed)
Case Management Note  Patient Details  Name: Lynn Jordan MRN: [id number] Date of Birth: 1925-05-24  Expected Discharge Date:  09/09/14               Expected Discharge Plan:  Home w Hospice Care  In-House Referral:  Hospice / Palliative Care  Discharge planning Services  CM Consult  Post Acute Care Choice:  Hospice Choice offered to:  NA  DME Arranged:    DME Agency:     HH Arranged:    HH Agency:  [facility]  Status of Service:  Completed, signed off  Medicare Important Message Given:  Yes-third notification given Date Medicare IM Given:    Medicare IM give by:    Date Additional Medicare IM Given:    Additional Medicare Important Message give by:     If discussed at Long Length of Stay Meetings, dates discussed:    Additional Comments: Pt's daughter anticipates DC home tomorrow with hospice care in the home. Pt and daughter have chosen [facility]. And referral was called to [facility]. Beth to see patient and daughter and they are agreeable to hospice services. Pt will be admitted into hospice after DC on 8/6. Hopsice will begin work to obtain [employer], overbed table, hospital bed and O2 for patient. No further CM needs at this time.  Malcolm Metro, RN 09/11/2014, 2:37 PM

## 2014-09-12 DIAGNOSIS — D696 Thrombocytopenia, unspecified: Secondary | ICD-10-CM

## 2014-09-12 MED ORDER — TORSEMIDE 20 MG PO TABS: 20.0000 mg | ORAL_TABLET | Freq: Two times a day (BID) | ORAL | Status: AC

## 2014-09-12 MED ORDER — MORPHINE SULFATE (CONCENTRATE) 10 MG /0.5 ML PO SOLN: 5.0000 mg | ORAL | Status: DC | PRN

## 2014-09-12 MED ORDER — OXYCODONE HCL 5 MG/5ML PO SOLN: 5.0000 mg | ORAL | Status: AC | PRN

## 2014-09-12 MED ORDER — DILTIAZEM HCL 30 MG PO TABS: 30.0000 mg | ORAL_TABLET | Freq: Two times a day (BID) | ORAL | Status: AC

## 2014-09-12 NOTE — Discharge Summary (Signed)
Physician Discharge Summary  Lynn Jordan WLN:[id number] DOB: 1926/01/20 DOA: 09/06/2014  PCP: Terald Sleeper, PA-C  Admit date: 09/06/2014 Discharge date: 09/12/2014  Time spent: Greater than 30 minutes  Recommendations for Outpatient Follow-up:  1. Patient was discharged to home with hospice services.  Discharge Diagnoses:  1. Transition to hospice services at home 2. Acute on chronic respiratory failure with hypoxia. 3. Severe aortic stenosis. 4. Acute on chronic diastolic dysfunction. 5. Severe tricuspid regurgitation. 6. Severe pulmonary hypertension/right heart failure. 7. Chronic atrial fibrillation with RVR. 8. Thrombocytopenia. 9. Essential hypertension. 10. Hypothyroidism. 11. Acute on chronic macrocytic anemia.      Discharge Condition: Stable but poor prognosis.  Diet recommendation: As tolerated.  Filed Weights   09/10/14 0646 09/11/14 0546 09/12/14 0506  Weight: 73.9 kg (162 lb 14.7 oz) 74.9 kg (165 lb 2 oz) 73.2 kg (161 lb 6 oz)    History of present illness:  Patient is an 81 year old woman with a history of chronic hypoxic respiratory failure, chronic atrial fibrillation, severe aortic stenosis, diastolic heart failure, and hypertension, who presented to the emergency department on 09/06/2014 with a chief complaint of shortness of breath and lower extremity swelling. In the ED, she was noted to be in rapid atrial fibrillation; chest x-ray revealed pulmonary edema; and her hemoglobin was 6.1. She was admitted for further evaluation and management.  Hospital Course:   The patient was started on a diltiazem drip and IV Lasix. Her chronic medications including Coreg, and benazepril were continued. Warfarin was discontinued in the setting of severe anemia. She was transfused 2 units of packed red blood cells for acute on chronic anemia. This was in the setting of anticoagulation, but her stool Hemoccults was initially negative. No further evaluation was  entertained as the patient was not a candidate for an invasive diagnostic workup such as an EGD or colonoscopy. A 2-D echocardiogram was ordered for further evaluation and then cardiology was consulted. Cardiologist, Dr. Jacinta Shoe reviewed the echocardiogram which showed severe aortic stenosis and severe tricuspid regurgitation; severe pulmonary hypertension and consequent right ventricular heart failure. He noted that she was hypotensive and recommended stopping Coreg and benazepril. He started short acting diltiazem at 30 mg twice a day following the discontinuation of IV diltiazem. Per his assessment, the patient was a very poor candidate for surgical management. He suspected she would have repeated hospitalizations until she succumbs to her severe valvular heart disease. He recommended palliative care consultation.  The palliative care team met with the patient's family, namely her daughter. Her poor prognosis was discussed. Hospice services for home was recommended and the patient and family agreed.  Patient diuresed well and appeared to be comfortable at the time of discharge, however, her prognosis was very poor, possibly days to weeks. All medications that were not conducive to her comfort were discontinued at the time of discharge. Her family was given a prescription for liquid oxycodone as needed for pain. The patient was discharged to home where hospice will follow her there.     Procedures:  Transfusion 2 units PRBC  Echo reveals: - Left ventricle: The cavity size was normal. There was mild concentric hypertrophy. Systolic function was normal. The estimated ejection fraction was in the range of 60% to 65%. The study was not technically sufficient to allow evaluation of LV diastolic dysfunction due to atrial fibrillation. However, in context of severe aortic stenosis with biatrial enlargement and pulmonary hypertension, findings strongly suggestive of  diastolic dysfunction. Doppler parameters are consistent with  both elevated ventricular end-diastolic filling pressure and elevated left atrial filling pressure. Ratio of mitral valve peak E velocity to medial annulus E (e&') velocity: 20.61. - Aortic valve: Moderately to severely calcified annulus. Moderately thickened, severely calcified leaflets. There was severe stenosis. Peak velocity (S): 411 cm/s. Mean gradient (S): 36 mm Hg. Valve area (VTI): 0.52 cm^2. Valve area (Vmax): 0.49 cm^2. Valve area (Vmean): 0.51 cm^2. - Mitral valve: Moderately to severely calcified annulus. There was moderate regurgitation. - Left atrium: The atrium was moderately to severely dilated. - Right ventricle: The cavity size was moderately dilated. Systolic function was moderately reduced. Lateral annular velocity 7.51 cm/s. - Right atrium: The atrium was massively dilated. - Atrial septum: The septum bowed from right to left, consistent with increased right atrial pressure. - Tricuspid valve: Mildly thickened leaflets. There was severe regurgitation. - Pulmonic valve: There was mild regurgitation. - Pulmonary arteries: PA peak pressure: 71 mm Hg (S). Severely elevated pulmonary pressures.   Consultations:  Cardiology  Palliative care  Discharge Exam: Filed Vitals:   09/12/14 0919  BP: 122/53  Pulse:   Temp:   Resp:    temperature 97.8. Pulse 76. Respiratory rate 16. Blood pressure 122/53. Oxygen saturation 100% on nasal cannula oxygen.  General: Debilitated appearing 81 year old woman in no acute distress. Cardiovascular: Distant irregular, irregular Respiratory: Decreased breath sounds at the bases and clear anteriorly.  Discharge Instructions   Discharge Instructions    Diet - low sodium heart healthy    Complete by:  As directed      Increase activity slowly    Complete by:  As directed           Current Discharge Medication List    START taking  these medications   Details  diltiazem (CARDIZEM) 30 MG tablet Take 1 tablet (30 mg total) by mouth every 12 (twelve) hours. Medication to help control your heart rate. Qty: 60 tablet, Refills: 1    oxyCODONE (ROXICODONE) 5 MG/5ML solution Take 5 mLs (5 mg total) by mouth every 4 (four) hours as needed for severe pain. Qty: 15 mL, Refills: 0    torsemide (DEMADEX) 20 MG tablet Take 1 tablet (20 mg total) by mouth 2 (two) times daily. Medication to prevent fluid buildup. Qty: 60 tablet, Refills: 1      CONTINUE these medications which have NOT CHANGED   Details  ALPRAZolam (XANAX) 0.25 MG tablet Take 0.25 mg by mouth every 6 (six) hours as needed for sleep.    levothyroxine (SYNTHROID, LEVOTHROID) 125 MCG tablet Take 125 mcg by mouth daily before breakfast.    OVER THE COUNTER MEDICATION Place 1 drop into both eyes 2 (two) times daily. OTC eye drops for dry eyes    potassium chloride (K-DUR,KLOR-CON) 10 MEQ tablet Take 20 mEq by mouth 2 (two) times daily.    sodium chloride (OCEAN) 0.65 % nasal spray Place 2 sprays into the nose 2 (two) times daily.       STOP taking these medications     benazepril (LOTENSIN) 10 MG tablet      calcium-vitamin D (OSCAL WITH D) 500-200 MG-UNIT per tablet      Ferrous Sulfate Dried (SLOW RELEASE IRON) 45 MG TBCR      furosemide (LASIX) 40 MG tablet      warfarin (COUMADIN) 5 MG tablet        Allergies  Allergen Reactions  . Codeine Nausea And Vomiting  . Morphine And Related Nausea And Vomiting  . Amoxicillin Nausea  And Vomiting and Rash      The results of significant diagnostics from this hospitalization (including imaging, microbiology, ancillary and laboratory) are listed below for reference.    Significant Diagnostic Studies: Dg Chest Port 1 View  09/06/2014   CLINICAL DATA:  Shortness of breath  EXAM: PORTABLE CHEST - 1 VIEW  COMPARISON:  06/21/2012  FINDINGS: Grossly unchanged enlarged cardiac silhouette and mediastinal  contours given accentuated kyphotic projection. Pulmonary vasculature appears less distinct than present examination with cephalization of flow. Interval development small bilateral effusions with associated worsening heterogeneous/consolidative opacities, left greater than right. No definite pneumothorax. Grossly unchanged bones.  IMPRESSION: Suspected development of pulmonary edema, bilateral effusions and associated bibasilar atelectasis on this portable kyphotic examination.   Electronically Signed   By: Sandi Mariscal M.D.   On: 09/06/2014 13:07    Microbiology: Recent Results (from the past 240 hour(s))  MRSA PCR Screening     Status: None   Collection Time: 09/06/14  3:34 PM  Result Value Ref Range Status   MRSA by PCR NEGATIVE NEGATIVE Final    Comment:        The GeneXpert MRSA Assay (FDA approved for NASAL specimens only), is one component of a comprehensive MRSA colonization surveillance program. It is not intended to diagnose MRSA infection nor to guide or monitor treatment for MRSA infections.      Labs: Basic Metabolic Panel:  Recent Labs Lab 09/07/14 0354 09/08/14 5320 09/09/14 0543 09/10/14 0558 09/11/14 0600  NA 142 142 141 141 142  K 3.4* 3.6 4.0 4.2 4.2  CL 93* 93* 92* 89* 87*  CO2 39* 43* 44* 47* 50*  GLUCOSE 101* 91 92 92 89  BUN 26* 30* 37* 41* 46*  CREATININE 0.72 0.67 0.78 0.72 0.81  CALCIUM 9.6 9.7 9.9 10.2 10.3   Liver Function Tests:  Recent Labs Lab 09/06/14 1302  AST 19  ALT 9*  ALKPHOS 77  BILITOT 1.2  PROT 6.5  ALBUMIN 2.8*   No results for input(s): LIPASE, AMYLASE in the last 168 hours. No results for input(s): AMMONIA in the last 168 hours. CBC:  Recent Labs Lab 09/06/14 1302 09/06/14 2143 09/07/14 0354 09/08/14 0638 09/09/14 0543  WBC 6.4 5.7 6.0 7.0 7.0  NEUTROABS 5.3  --   --   --   --   HGB 6.1* 8.5* 8.4* 7.9* 8.0*  HCT 21.7* 28.2* 27.8* 27.1* 27.8*  MCV 102.4* 98.6 97.2 100.4* 103.3*  PLT 104* 86* 90* 100* 91*    Cardiac Enzymes:  Recent Labs Lab 09/06/14 1302 09/06/14 1538 09/06/14 2143 09/07/14 0354  TROPONINI 0.03 0.03 0.03 0.03   BNP: BNP (last 3 results)  Recent Labs  09/06/14 1302  BNP 403.0*    ProBNP (last 3 results) No results for input(s): PROBNP in the last 8760 hours.  CBG:  Recent Labs Lab 09/08/14 0822  GLUCAP 123*       Signed:  Zuri Lascala  [facility] 09/12/2014, 11:07 AM

## 2014-09-12 NOTE — Progress Notes (Signed)
Pt's IV catheter removed and intact. Pt's IV site clean dry and intact. Discharge instructions and medications reviewed and discussed with patient's daughter.  All questions were answered and no further questions at this time. Per MD patient to remain with foley catheter at discharge.  [facility]) notified of patient's discharge. Pt transported by [facility]. Pt in stable condition and in no acute distress at time of discharge.

## 2014-10-08 DEATH — deceased

## 2016-11-26 IMAGING — CR DG CHEST 1V PORT
1 series · 1 of 1 positions shown · non-contrast
Comparison: 06/21/2012

CLINICAL DATA: Shortness of breath

EXAM:
PORTABLE CHEST - 1 VIEW

[ap portable]
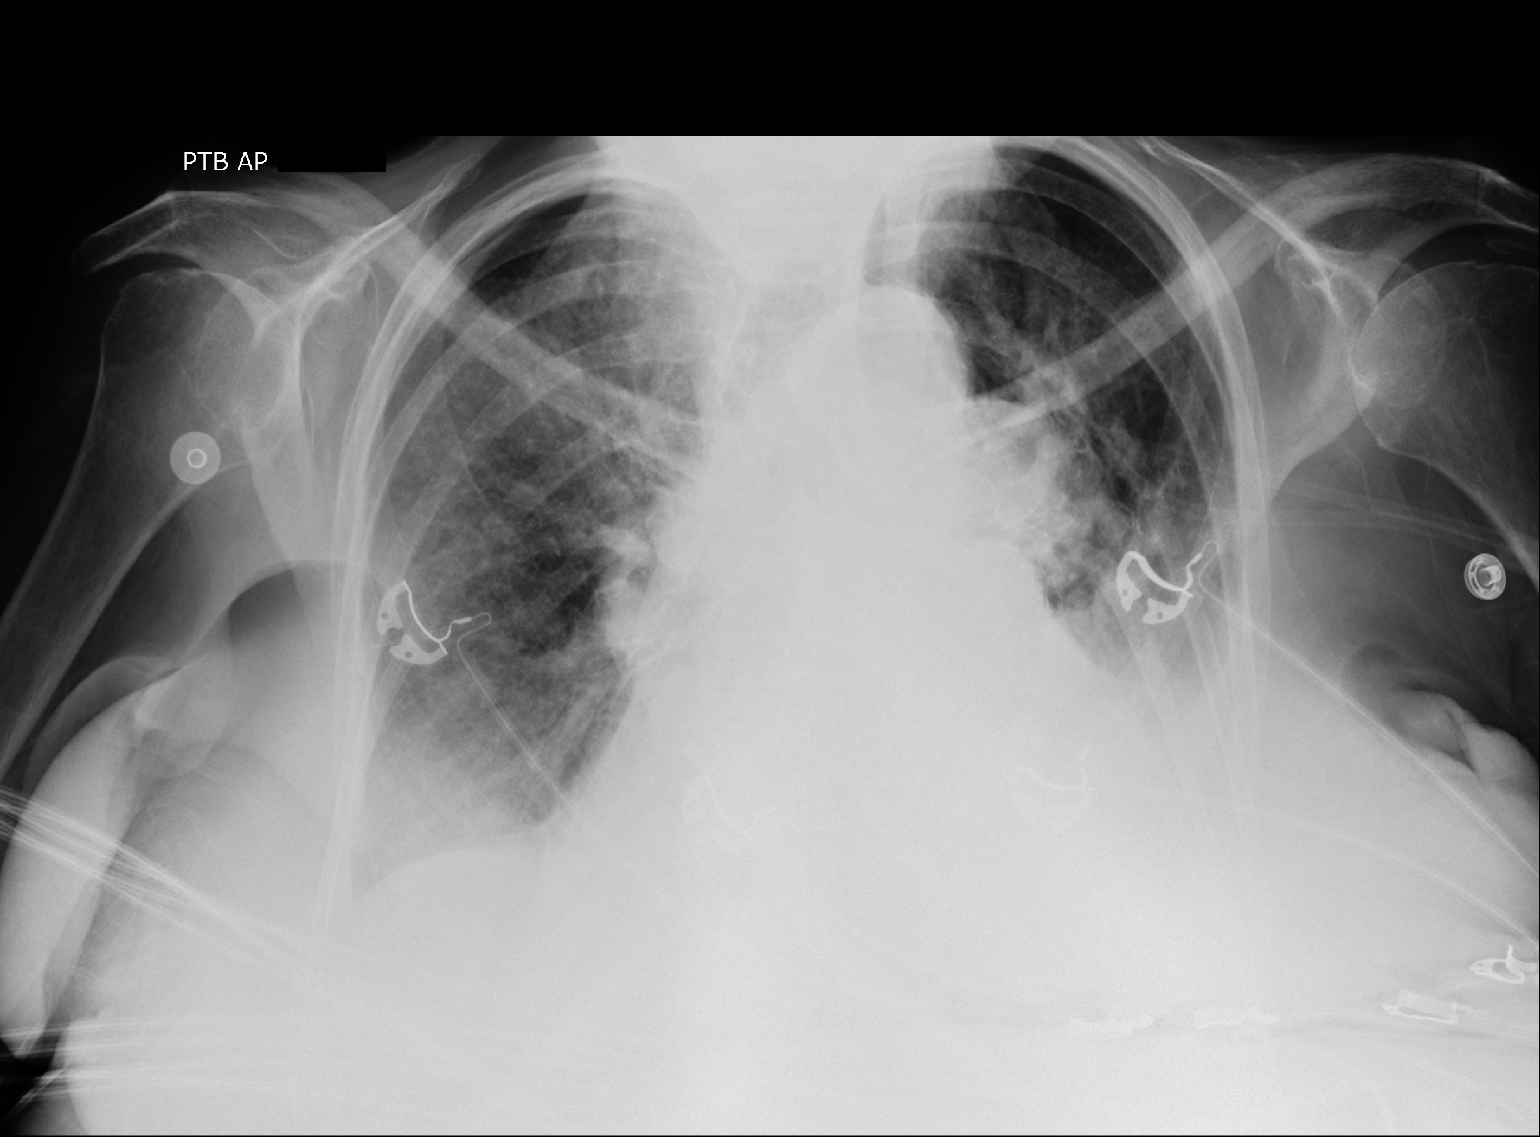

[1 of 1 positions shown; findings below may reference images not displayed]

FINDINGS: Grossly unchanged enlarged cardiac silhouette and mediastinal
contours given accentuated kyphotic projection. Pulmonary
vasculature appears less distinct than present examination with
cephalization of flow. Interval development small bilateral
effusions with associated worsening heterogeneous/consolidative
opacities, left greater than right. No definite pneumothorax.
Grossly unchanged bones.
IMPRESSION: Suspected development of pulmonary edema, bilateral effusions and
associated bibasilar atelectasis on this portable kyphotic
examination.
